# Patient Record
Sex: Male | Born: 1950 | Race: White | Hispanic: No | Marital: Single | Smoking: Never smoker
Health system: Southern US, Community
[De-identification: ages and names within clinical notes are randomized; demographics above are authoritative.]

## PROBLEM LIST (undated history)

## (undated) DIAGNOSIS — E785 Hyperlipidemia, unspecified: Secondary | ICD-10-CM

## (undated) DIAGNOSIS — M1990 Unspecified osteoarthritis, unspecified site: Secondary | ICD-10-CM

## (undated) DIAGNOSIS — J4520 Mild intermittent asthma, uncomplicated: Secondary | ICD-10-CM

## (undated) DIAGNOSIS — F329 Major depressive disorder, single episode, unspecified: Secondary | ICD-10-CM

## (undated) DIAGNOSIS — C801 Malignant (primary) neoplasm, unspecified: Secondary | ICD-10-CM

## (undated) DIAGNOSIS — F419 Anxiety disorder, unspecified: Secondary | ICD-10-CM

## (undated) DIAGNOSIS — F32A Depression, unspecified: Secondary | ICD-10-CM

## (undated) DIAGNOSIS — I10 Essential (primary) hypertension: Secondary | ICD-10-CM

## (undated) DIAGNOSIS — K219 Gastro-esophageal reflux disease without esophagitis: Secondary | ICD-10-CM

## (undated) DIAGNOSIS — N189 Chronic kidney disease, unspecified: Secondary | ICD-10-CM

## (undated) HISTORY — DX: Unspecified osteoarthritis, unspecified site: M19.90

## (undated) HISTORY — DX: Malignant (primary) neoplasm, unspecified: C80.1

## (undated) HISTORY — DX: Chronic kidney disease, unspecified: N18.9

## (undated) HISTORY — DX: Anxiety disorder, unspecified: F41.9

## (undated) HISTORY — PX: HERNIA REPAIR: SHX51

## (undated) HISTORY — DX: Major depressive disorder, single episode, unspecified: F32.9

## (undated) HISTORY — DX: Depression, unspecified: F32.A

## (undated) HISTORY — DX: Hyperlipidemia, unspecified: E78.5

## (undated) HISTORY — DX: Gastro-esophageal reflux disease without esophagitis: K21.9

## (undated) HISTORY — DX: Mild intermittent asthma, uncomplicated: J45.20

## (undated) HISTORY — PX: APPENDECTOMY: SHX54

## (undated) HISTORY — PX: FINGER AMPUTATION: SHX636

## (undated) HISTORY — DX: Essential (primary) hypertension: I10

---

## 2007-05-05 ENCOUNTER — Emergency Department (HOSPITAL_COMMUNITY): Admission: EM | Admit: 2007-05-05 | Discharge: 2007-05-06 | Payer: Self-pay | Admitting: Emergency Medicine

## 2007-05-14 ENCOUNTER — Emergency Department (HOSPITAL_COMMUNITY): Admission: EM | Admit: 2007-05-14 | Discharge: 2007-05-14 | Payer: Self-pay | Admitting: Emergency Medicine

## 2007-10-26 IMAGING — CT CT ABDOMEN W/ CM
1 of 3 series · 13 of 32 positions shown, 19 images · IV contrast (Omnipaque 300)
Comparison: None

CLINICAL DATA: Abdominal and pelvic pain status post injury to the abdomen.  Abdominal distention 
 ABDOMEN CT WITH CONTRAST:
TECHNIQUE: Multidetector CT imaging of the abdomen was performed following the standard protocol during bolus administration of intravenous contrast.
 Contrast:  Oral contrast and 100 cc intravenous Omnipaque 300.
TECHNIQUE: Multidetector CT imaging of the pelvis was performed following the standard protocol during bolus administration of intravenous contrast.

[Series 2: abd_pel 5.0 b40f · axial · 0.80mm/px · z∈[-512,-82]mm · 13 of 102 slices shown, 19 images]
[im 8/102  soft-tissue]
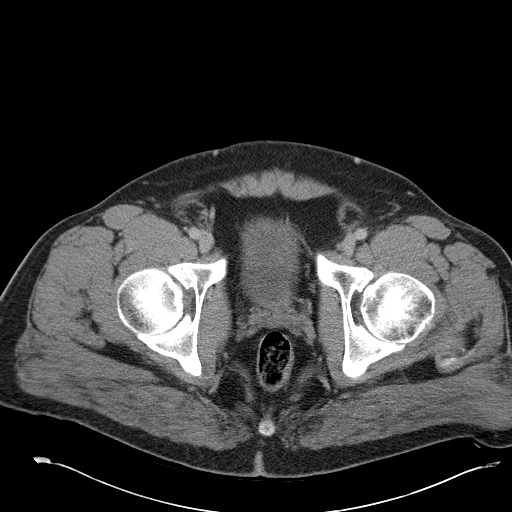
[im 8/102  bone]
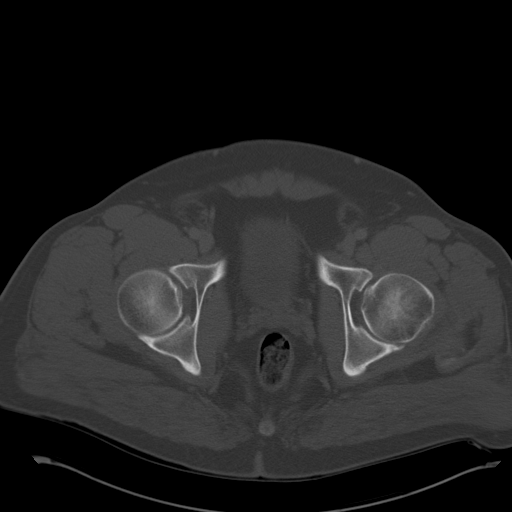
[im 15/102  soft-tissue]
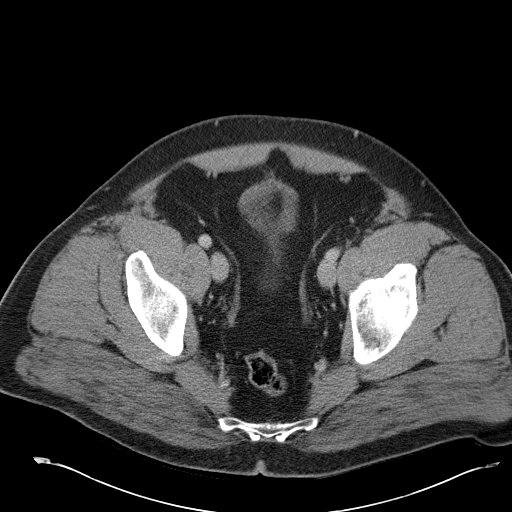
[im 22/102  soft-tissue]
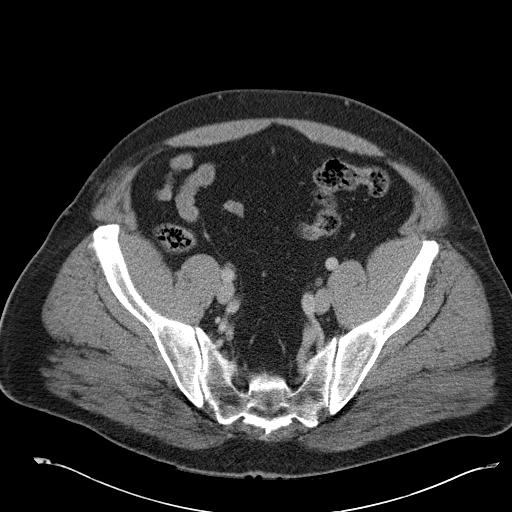
[im 29/102  soft-tissue]
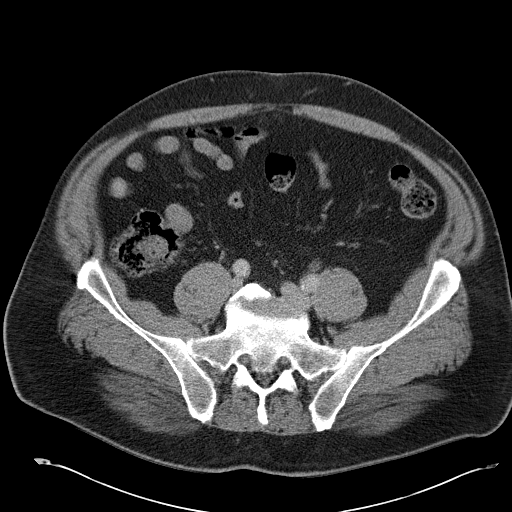
[im 37/102  soft-tissue]
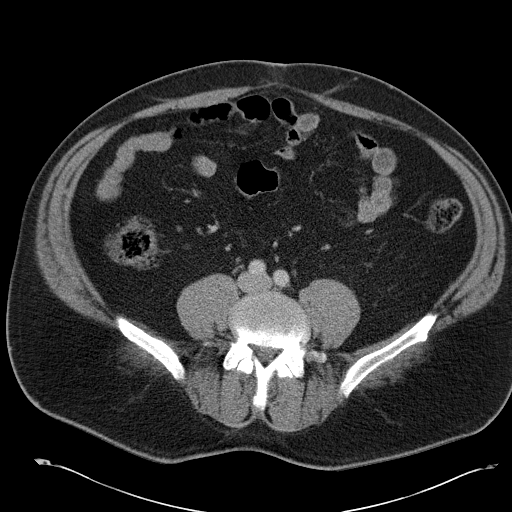
[im 44/102  soft-tissue]
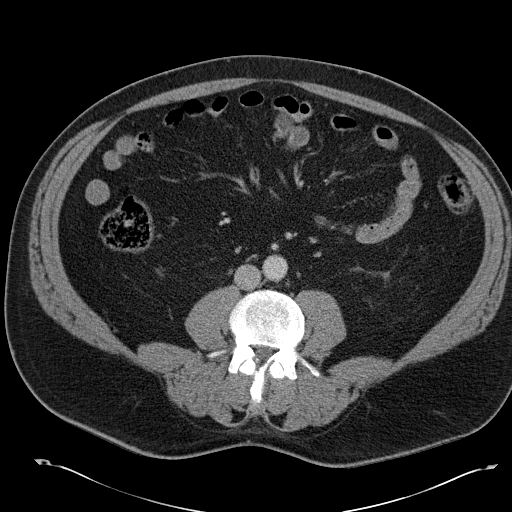
[im 51/102  soft-tissue]
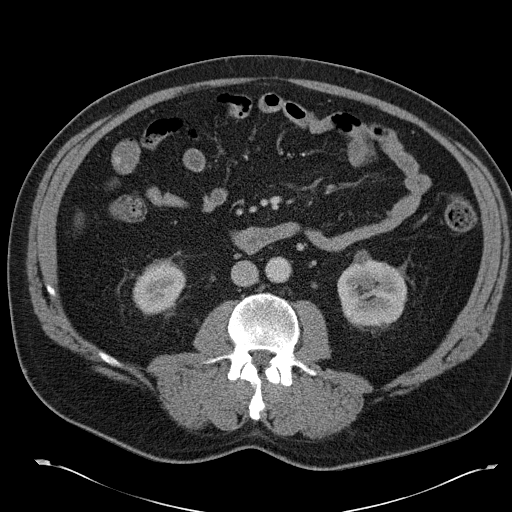
[im 58/102  soft-tissue]
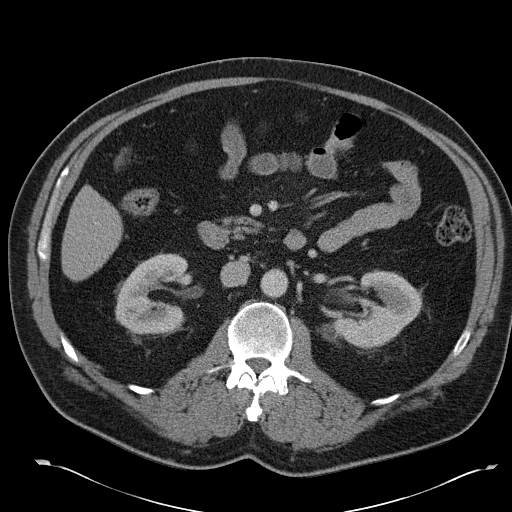
[im 65/102  soft-tissue]
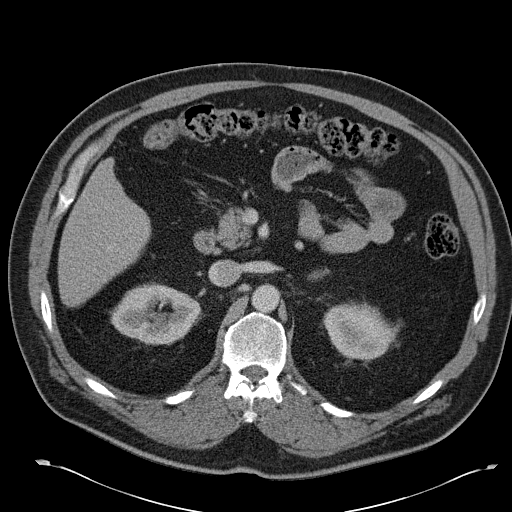
[im 65/102  bone]
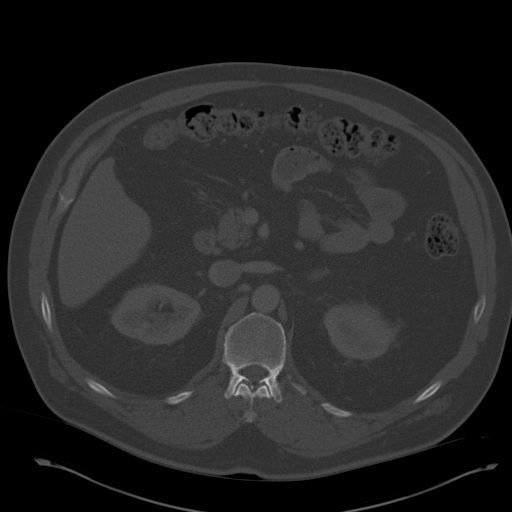
[im 73/102  soft-tissue]
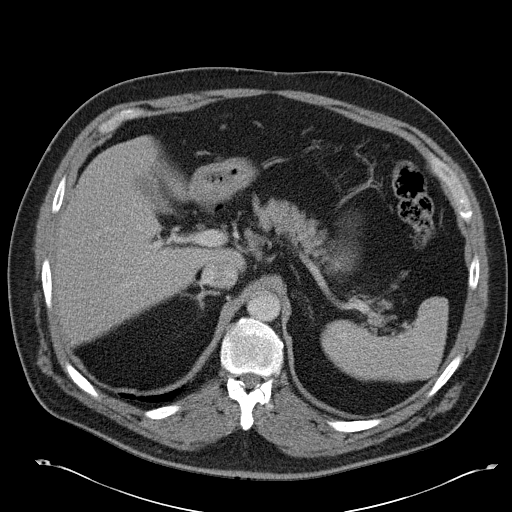
[im 73/102  lung]
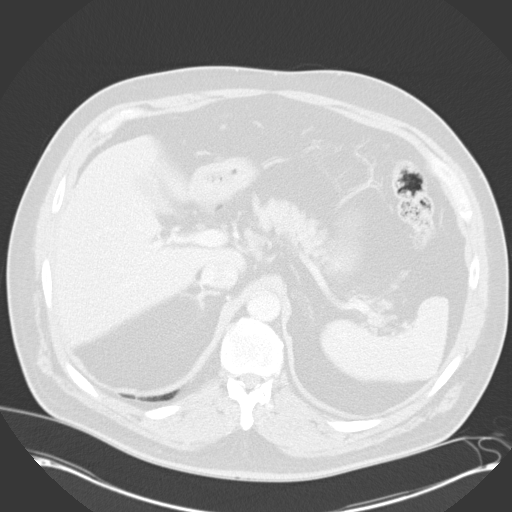
[im 80/102  soft-tissue]
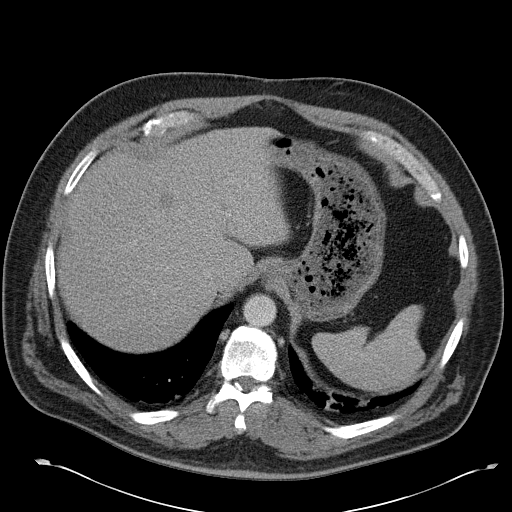
[im 80/102  lung]
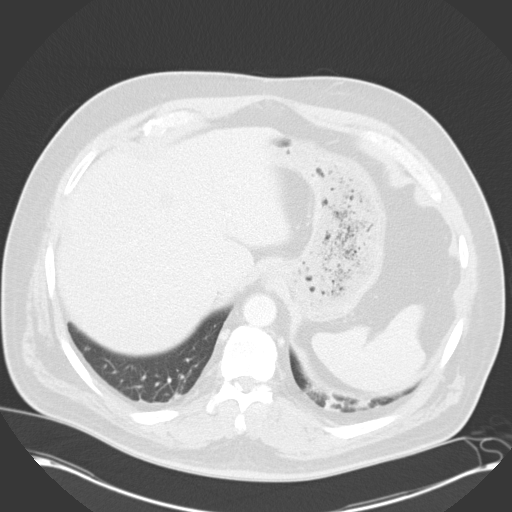
[im 87/102  soft-tissue]
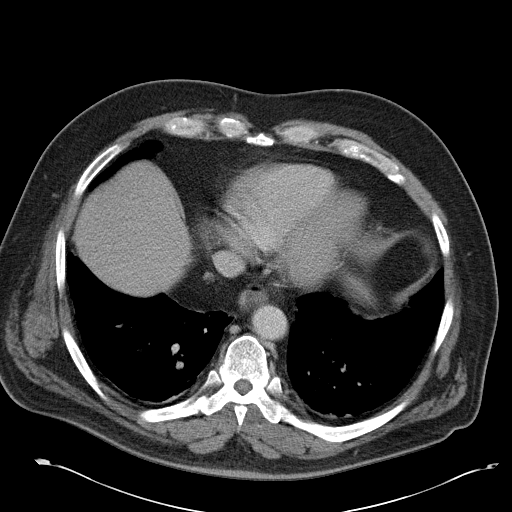
[im 87/102  lung]
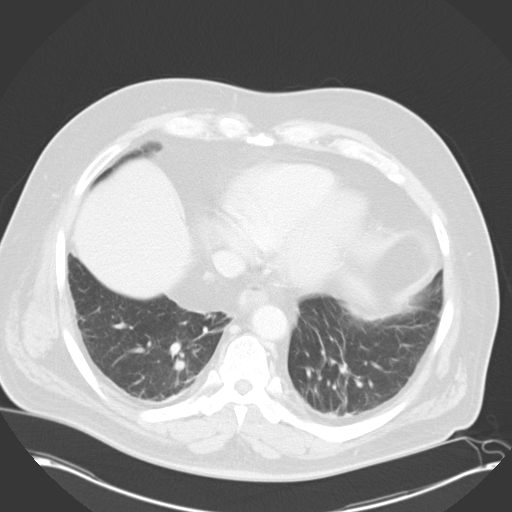
[im 94/102  soft-tissue]
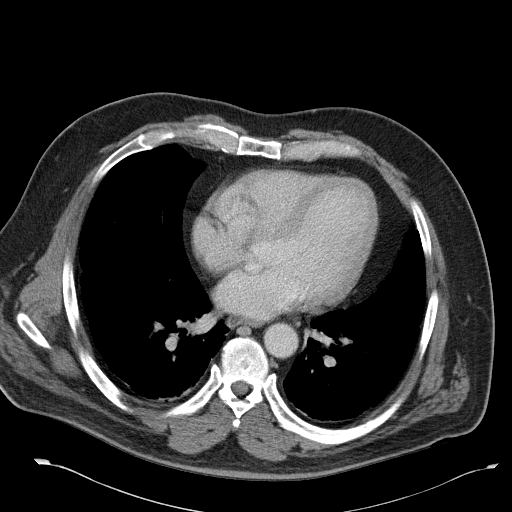
[im 94/102  lung]
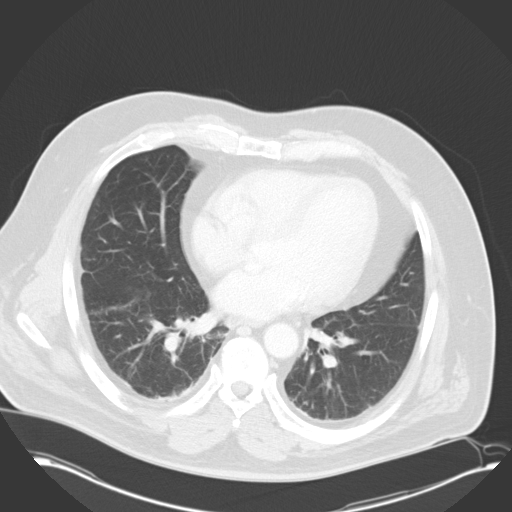

[13 of 32 positions shown; findings below may reference images not displayed]

FINDINGS: A few hepatic cysts are noted with the remainder of the liver, spleen, adrenal glands, pancreas, and gallbladder are unremarkable.  Low density lesions within the left kidney are indeterminate but probably represent cysts as these lesions all measures 10 Hounsfield units or less.   No evidence of free fluid, enlarged lymph nodes, abdominal aortic aneurysm, or biliary dilatation.   Small umbilical hernia containing fat is identified. The visualized bowel is within normal limits.
IMPRESSION: No acute abnormalities.  
 PELVIS CT WITH CONTRAST:
FINDINGS: No evidence of free fluid collection or enlarged lymph nodes.  The bowel and bladder are within normal limits.  Degenerative changes in the lower lumbar spine identified.
IMPRESSION: No acute abnormalities.

## 2011-07-15 LAB — URINALYSIS, ROUTINE W REFLEX MICROSCOPIC
Bilirubin Urine: NEGATIVE
Nitrite: NEGATIVE
Specific Gravity, Urine: <1.005 — ABNORMAL LOW
Urobilinogen, UA: 0.2
pH: 6

## 2011-07-15 LAB — AMYLASE: Amylase: 43

## 2011-07-15 LAB — DIFFERENTIAL
Basophils Absolute: 0
Basophils Absolute: 0.1
Basophils Relative: 1
Basophils Relative: 1
Eosinophils Absolute: 0.2
Lymphocytes Relative: 19
Lymphocytes Relative: 31
Monocytes Absolute: 0.4
Monocytes Absolute: 0.4
Neutro Abs: 5.2
Neutrophils Relative %: 73

## 2011-07-15 LAB — BASIC METABOLIC PANEL
BUN: 12
BUN: 5 — ABNORMAL LOW
CO2: 26
Calcium: 8.7
Chloride: 103
Creatinine, Ser: 1.03
GFR calc Af Amer: >60
GFR calc Af Amer: >60
GFR calc non Af Amer: >60
GFR calc non Af Amer: >60
Glucose, Bld: 163 — ABNORMAL HIGH
Potassium: 3.6
Potassium: 3.8
Sodium: 137
Sodium: 141

## 2011-07-15 LAB — CBC
HCT: 44.3
HCT: 45.6
Hemoglobin: 15
Hemoglobin: 15.4
MCHC: 33.8
MCHC: 34
MCV: 84.8
MCV: 84.9
Platelets: 300
Platelets: 302
RBC: 5.22
RBC: 5.37
RDW: 12.9
RDW: 12.9
WBC: 6.4
WBC: 7.1

## 2016-12-30 DIAGNOSIS — K219 Gastro-esophageal reflux disease without esophagitis: Secondary | ICD-10-CM | POA: Diagnosis not present

## 2016-12-30 DIAGNOSIS — E785 Hyperlipidemia, unspecified: Secondary | ICD-10-CM | POA: Diagnosis not present

## 2016-12-30 DIAGNOSIS — R51 Headache: Secondary | ICD-10-CM | POA: Diagnosis not present

## 2017-05-16 ENCOUNTER — Ambulatory Visit (INDEPENDENT_AMBULATORY_CARE_PROVIDER_SITE_OTHER): Payer: Medicare Other | Admitting: Family Medicine

## 2017-05-16 ENCOUNTER — Encounter: Payer: Self-pay | Admitting: Family Medicine

## 2017-05-16 VITALS — BP 150/76 | HR 64 | Temp 98.7°F | Resp 18 | Ht 72.5 in | Wt 283.1 lb

## 2017-05-16 DIAGNOSIS — J4520 Mild intermittent asthma, uncomplicated: Secondary | ICD-10-CM | POA: Diagnosis not present

## 2017-05-16 DIAGNOSIS — I10 Essential (primary) hypertension: Secondary | ICD-10-CM

## 2017-05-16 DIAGNOSIS — K219 Gastro-esophageal reflux disease without esophagitis: Secondary | ICD-10-CM | POA: Insufficient documentation

## 2017-05-16 DIAGNOSIS — Z1211 Encounter for screening for malignant neoplasm of colon: Secondary | ICD-10-CM | POA: Diagnosis not present

## 2017-05-16 DIAGNOSIS — Z9109 Other allergy status, other than to drugs and biological substances: Secondary | ICD-10-CM

## 2017-05-16 DIAGNOSIS — E559 Vitamin D deficiency, unspecified: Secondary | ICD-10-CM

## 2017-05-16 DIAGNOSIS — F339 Major depressive disorder, recurrent, unspecified: Secondary | ICD-10-CM | POA: Diagnosis not present

## 2017-05-16 DIAGNOSIS — E785 Hyperlipidemia, unspecified: Secondary | ICD-10-CM

## 2017-05-16 DIAGNOSIS — G4700 Insomnia, unspecified: Secondary | ICD-10-CM

## 2017-05-16 DIAGNOSIS — H1010 Acute atopic conjunctivitis, unspecified eye: Secondary | ICD-10-CM | POA: Insufficient documentation

## 2017-05-16 DIAGNOSIS — H1013 Acute atopic conjunctivitis, bilateral: Secondary | ICD-10-CM

## 2017-05-16 DIAGNOSIS — Z85828 Personal history of other malignant neoplasm of skin: Secondary | ICD-10-CM | POA: Diagnosis not present

## 2017-05-16 HISTORY — DX: Mild intermittent asthma, uncomplicated: J45.20

## 2017-05-16 MED ORDER — MIRTAZAPINE 15 MG PO TABS: 15.0000 mg | ORAL_TABLET | Freq: Every day | ORAL | 11 refills | Status: DC

## 2017-05-16 NOTE — Progress Notes (Signed)
Chief Complaint  Patient presents with  . Follow-up  This is a new patient. I have no medical records. He is accompanied by a Education officer, museum who helps him with appointments and paying his bills. He only went sixth grade and doesn't read. He is not medically sophisticated. He doesn't notice diagnoses. He does bring in his medications, his problem list is populated from the usual diagnoses associated with the pills he takes. He has blood pressure, well controlled. He has hyperlipidemia. Uncertain when he had his lab work last. He has allergies, and allergic conjunctivitis. He has albuterol inhaler, and never smoked. Assume he has asthma. He takes ranitidine for "bad heartburn". He states he has a history of depression. He has difficulty sleeping. He was recently started on trazodone. He states that it made him hallucinate. He would like something else to sleep. I'm going to try him on mirtazapine. He lives in his own home. He takes care of his home in his yard. He has help with his finances, mail, shopping, and paperwork. He states previously he would drive a vehicle but does not currently. He is not up-to-date with health care. He cannot tell me his immunizations. he's never had a colonoscopy. He does have a history of multiple skin cancers. Unclear type. Patient Active Problem List   Diagnosis Date Noted  . Essential hypertension 05/16/2017  . HLD (hyperlipidemia) 05/16/2017  . Environmental allergies 05/16/2017  . Allergic conjunctivitis 05/16/2017  . Vitamin D deficiency 05/16/2017  . Asthma in adult, mild intermittent, uncomplicated 23/55/7322  . GERD without esophagitis 05/16/2017  . Depression, recurrent ([place]) 05/16/2017  . Insomnia 05/16/2017    Outpatient Encounter Prescriptions as of 05/16/2017  Medication Sig  . albuterol (PROVENTIL HFA;VENTOLIN HFA) 108 (90 Base) MCG/ACT inhaler Inhale into the lungs every 6 (six) hours as needed for wheezing or shortness of breath.  Marland Kitchen  amLODipine (NORVASC) 10 MG tablet Take 10 mg by mouth daily.  Marland Kitchen aspirin EC 81 MG tablet Take 81 mg by mouth daily.  . cetirizine (ZYRTEC) 10 MG tablet Take 10 mg by mouth daily.  . cholecalciferol (VITAMIN D) 1000 units tablet Take 1,000 Units by mouth daily.  . fluticasone (FLONASE) 50 MCG/ACT nasal spray Place into both nostrils daily.  Marland Kitchen lisinopril-hydrochlorothiazide (PRINZIDE,ZESTORETIC) 20-25 MG tablet Take 1 tablet by mouth daily.  Marland Kitchen olopatadine (PATANOL) 0.1 % ophthalmic solution Place 1 drop into both eyes daily.  . pravastatin (PRAVACHOL) 80 MG tablet Take 80 mg by mouth daily.  . ranitidine (ZANTAC) 150 MG capsule Take 150 mg by mouth 2 (two) times daily.  . mirtazapine (REMERON) 15 MG tablet Take 1 tablet (15 mg total) by mouth at bedtime.   No facility-administered encounter medications on file as of 05/16/2017.     Past Medical History:  Diagnosis Date  . Anxiety   . Arthritis    left shoulder and knee  . Asthma in adult, mild intermittent, uncomplicated 0/25/4270  . Cancer ([place])    skin  . Depression   . GERD (gastroesophageal reflux disease)   . Hyperlipidemia   . Hypertension     Past Surgical History:  Procedure Laterality Date  . APPENDECTOMY    . FINGER AMPUTATION    . HERNIA REPAIR      Social History   Social History  . Marital status: Single    Spouse name: N/A  . Number of children: 0  . Years of education: 6   Occupational History  . retired  Social History Main Topics  . Smoking status: Never Smoker  . Smokeless tobacco: Never Used  . Alcohol use No  . Drug use: No  . Sexual activity: Not Currently   Other Topics Concern  . Not on file   Social History Narrative   Lives alone   Niece and sisters look in   Does not drive   Does not read well   "Congregates in the country store"    Family History  Problem Relation Age of Onset  . Heart disease Father   . Cancer Father   . COPD Sister   . Cancer Brother   . Pneumonia  Brother   . Cancer Brother     Review of Systems  Constitutional: Negative for chills, fever and weight loss.  HENT: Negative for congestion and hearing loss.   Eyes: Negative for blurred vision and pain.  Respiratory: Negative for cough and shortness of breath.   Cardiovascular: Negative for chest pain and leg swelling.  Gastrointestinal: Negative for abdominal pain, constipation, diarrhea and heartburn.  Genitourinary: Negative for dysuria and frequency.  Musculoskeletal: Negative for falls, joint pain and myalgias.  Neurological: Negative for dizziness, seizures and headaches.  Psychiatric/Behavioral: Negative for depression. The patient has insomnia. The patient is not nervous/anxious.    denies complaints  BP (!) 150/76 (BP Location: Right Arm, Patient Position: Sitting, Cuff Size: Large)   Pulse 64   Temp 98.7 F (37.1 C) (Temporal)   Resp 18   Ht 6' 0.5" (1.842 m)   Wt 283 lb 1.9 oz (128.4 kg)   SpO2 97%   BMI 37.87 kg/m   Physical Exam  Constitutional: He is oriented to person, place, and time. He appears well-developed and well-nourished.  Overweight, abdominal obesity  HENT:  Head: Normocephalic and atraumatic.  Mouth/Throat: Oropharynx is clear and moist.  Edentulous  Eyes: Pupils are equal, round, and reactive to light. Conjunctivae are normal.  Neck: Normal range of motion. Neck supple. No thyromegaly present.  Cardiovascular: Normal rate, regular rhythm and normal heart sounds.   Pulmonary/Chest: Effort normal and breath sounds normal. No respiratory distress.  Abdominal: Soft. Bowel sounds are normal.  Musculoskeletal: Normal range of motion. He exhibits no edema.  Lymphadenopathy:    He has no cervical adenopathy.  Neurological: He is alert and oriented to person, place, and time.  Gait normal  Skin: Skin is warm and dry.  Medial canthus of left eye on nose there is a scar with lesion at the periphery suspicious for recurring cancer  Psychiatric: He has  a normal mood and affect. His behavior is normal. Thought content normal.  Pleasant. Articulate.  Nursing note and vitals reviewed. ASSESSMENT/PLAN:   1. Essential hypertension - CBC - COMPLETE METABOLIC PANEL WITH GFR - Urinalysis, Routine w reflex microscopic 2. Hyperlipidemia, unspecified hyperlipidemia type - Lipid panel 3. Environmental allergies  4. Allergic conjunctivitis of both eyes  5. Vitamin D deficiency - VITAMIN D 25 Hydroxy (Vit-D Deficiency, Fractures)  6. Asthma in adult, mild intermittent, uncomplicated  7. GERD without esophagitis  8. Depression, recurrent ([place])  9. Insomnia, unspecified type  10. Screening for colon cancer  11. History of skin cancer - Ambulatory referral to Dermatology   Patient Instructions  Stop trazodone Take the mirtazapine at bedtime Need blood tests Referred to dermatology Colon cancer test ordered  Need records Dr Wenda Overland  See me in a month for a PE    Raylene Everts, MD

## 2017-05-16 NOTE — Patient Instructions (Signed)
Stop trazodone Take the mirtazapine at bedtime Need blood tests Referred to dermatology Colon cancer test ordered  Need records Dr Wenda Overland  See me in a month for a PE

## 2017-05-21 DIAGNOSIS — I10 Essential (primary) hypertension: Secondary | ICD-10-CM | POA: Diagnosis not present

## 2017-05-21 DIAGNOSIS — E559 Vitamin D deficiency, unspecified: Secondary | ICD-10-CM | POA: Diagnosis not present

## 2017-05-21 DIAGNOSIS — E785 Hyperlipidemia, unspecified: Secondary | ICD-10-CM | POA: Diagnosis not present

## 2017-05-21 LAB — CBC
HEMATOCRIT: 45.3 % (ref 38.5–50.0)
HEMOGLOBIN: 15.3 g/dL (ref 13.2–17.1)
MCH: 28.7 pg (ref 27.0–33.0)
MCHC: 33.8 g/dL (ref 32.0–36.0)
MCV: 85 fL (ref 80.0–100.0)
MPV: 9.7 fL (ref 7.5–12.5)
Platelets: 331 10*3/uL (ref 140–400)
RBC: 5.33 MIL/uL (ref 4.20–5.80)
RDW: 13.8 % (ref 11.0–15.0)
WBC: 6.8 10*3/uL (ref 3.8–10.8)

## 2017-05-21 LAB — COMPLETE METABOLIC PANEL WITH GFR
ALBUMIN: 4.4 g/dL (ref 3.6–5.1)
ALK PHOS: 55 U/L (ref 40–115)
ALT: 15 U/L (ref 9–46)
AST: 14 U/L (ref 10–35)
BILIRUBIN TOTAL: 1.2 mg/dL (ref 0.2–1.2)
BUN: 11 mg/dL (ref 7–25)
CALCIUM: 9.4 mg/dL (ref 8.6–10.3)
CO2: 26 mmol/L (ref 20–32)
CREATININE: 1.41 mg/dL — AB (ref 0.70–1.25)
Chloride: 101 mmol/L (ref 98–110)
GFR, EST AFRICAN AMERICAN: 59 mL/min — AB (ref >=60)
GFR, Est Non African American: 51 mL/min — ABNORMAL LOW (ref >=60)
Glucose, Bld: 101 mg/dL — ABNORMAL HIGH (ref 65–99)
Potassium: 3.8 mmol/L (ref 3.5–5.3)
Sodium: 137 mmol/L (ref 135–146)
TOTAL PROTEIN: 7.3 g/dL (ref 6.1–8.1)

## 2017-05-21 LAB — LIPID PANEL
CHOLESTEROL: 155 mg/dL (ref <200)
HDL: 42 mg/dL (ref >40)
LDL Cholesterol: 86 mg/dL (ref <100)
TRIGLYCERIDES: 134 mg/dL (ref <150)
Total CHOL/HDL Ratio: 3.7 Ratio (ref <5.0)
VLDL: 27 mg/dL (ref <30)

## 2017-05-22 LAB — URINALYSIS, ROUTINE W REFLEX MICROSCOPIC
Bilirubin Urine: NEGATIVE
Glucose, UA: NEGATIVE
HGB URINE DIPSTICK: NEGATIVE
KETONES UR: NEGATIVE
LEUKOCYTES UA: NEGATIVE
NITRITE: NEGATIVE
PH: 6 (ref 5.0–8.0)
Protein, ur: NEGATIVE
SPECIFIC GRAVITY, URINE: 1.006 (ref 1.001–1.035)

## 2017-05-22 LAB — VITAMIN D 25 HYDROXY (VIT D DEFICIENCY, FRACTURES): Vit D, 25-Hydroxy: 36 ng/mL (ref 30–100)

## 2017-05-27 ENCOUNTER — Encounter: Payer: Self-pay | Admitting: Family Medicine

## 2017-06-06 ENCOUNTER — Encounter: Payer: Self-pay | Admitting: Family Medicine

## 2017-06-06 DIAGNOSIS — C4492 Squamous cell carcinoma of skin, unspecified: Secondary | ICD-10-CM | POA: Insufficient documentation

## 2017-06-19 ENCOUNTER — Encounter: Payer: Medicare Other | Admitting: Family Medicine

## 2017-06-24 ENCOUNTER — Other Ambulatory Visit: Payer: Self-pay | Admitting: Family Medicine

## 2017-07-21 ENCOUNTER — Encounter: Payer: Medicare Other | Admitting: Family Medicine

## 2017-08-04 ENCOUNTER — Encounter: Payer: Medicare Other | Admitting: Family Medicine

## 2017-08-06 ENCOUNTER — Ambulatory Visit (INDEPENDENT_AMBULATORY_CARE_PROVIDER_SITE_OTHER): Payer: Medicare Other | Admitting: Family Medicine

## 2017-08-06 ENCOUNTER — Other Ambulatory Visit: Payer: Self-pay

## 2017-08-06 ENCOUNTER — Encounter: Payer: Self-pay | Admitting: Family Medicine

## 2017-08-06 VITALS — BP 140/76 | HR 60 | Temp 98.8°F | Resp 18 | Ht 73.0 in | Wt 290.0 lb

## 2017-08-06 DIAGNOSIS — Z23 Encounter for immunization: Secondary | ICD-10-CM

## 2017-08-06 DIAGNOSIS — E785 Hyperlipidemia, unspecified: Secondary | ICD-10-CM | POA: Diagnosis not present

## 2017-08-06 DIAGNOSIS — Z1211 Encounter for screening for malignant neoplasm of colon: Secondary | ICD-10-CM

## 2017-08-06 DIAGNOSIS — Z0000 Encounter for general adult medical examination without abnormal findings: Secondary | ICD-10-CM | POA: Diagnosis not present

## 2017-08-06 DIAGNOSIS — I10 Essential (primary) hypertension: Secondary | ICD-10-CM | POA: Diagnosis not present

## 2017-08-06 DIAGNOSIS — K219 Gastro-esophageal reflux disease without esophagitis: Secondary | ICD-10-CM | POA: Diagnosis not present

## 2017-08-06 DIAGNOSIS — I499 Cardiac arrhythmia, unspecified: Secondary | ICD-10-CM

## 2017-08-06 NOTE — Patient Instructions (Addendum)
Need blood work and a follow up in six months No change in medicine Still need to see a dermatologist I ordered home test for colon cancer  Flu shot today

## 2017-08-06 NOTE — Progress Notes (Signed)
Chief Complaint  Patient presents with  . Annual Exam   Patient is here for his annual exam He voices no concerns   He had recent lab work performed.  The results are reviewed with him.  His cholesterol is well controlled with an LDL of 86.  No diabetes.  Kidney function minimally impaired with a creatinine of 1.4.  Will follow.  Advised him to stop drinking Pepsi and drink more water.  No problems with bowels or digestion, no problems with urination.  No recent use of his inhaler problems with asthma. He was started on mirtazapine to help him sleep at night.  He states this works well for him. He does not have any chest pain or shortness of breath.  Risk factors for cardiovascular disease include obesity, hypertension, hyperlipidemia.  Patient Active Problem List   Diagnosis Date Noted  . Squamous cell skin cancer 06/06/2017  . Essential hypertension 05/16/2017  . HLD (hyperlipidemia) 05/16/2017  . Environmental allergies 05/16/2017  . Allergic conjunctivitis 05/16/2017  . Vitamin D deficiency 05/16/2017  . Asthma in adult, mild intermittent, uncomplicated 02/72/5366  . GERD without esophagitis 05/16/2017  . Depression, recurrent ([place]) 05/16/2017  . Insomnia 05/16/2017    Outpatient Encounter Medications as of 08/06/2017  Medication Sig  . albuterol (PROVENTIL HFA;VENTOLIN HFA) 108 (90 Base) MCG/ACT inhaler Inhale into the lungs every 6 (six) hours as needed for wheezing or shortness of breath.  Marland Kitchen amLODipine (NORVASC) 10 MG tablet Take 10 mg by mouth daily.  Marland Kitchen aspirin EC 81 MG tablet Take 81 mg by mouth daily.  . cetirizine (ZYRTEC) 10 MG tablet Take 10 mg by mouth daily.  . cholecalciferol (VITAMIN D) 1000 units tablet Take 1,000 Units by mouth daily.  . fluticasone (FLONASE) 50 MCG/ACT nasal spray Place into both nostrils daily.  Marland Kitchen lisinopril-hydrochlorothiazide (PRINZIDE,ZESTORETIC) 20-25 MG tablet Take 1 tablet by mouth daily.  . mirtazapine (REMERON) 15 MG tablet Take 1  tablet (15 mg total) by mouth at bedtime.  Marland Kitchen olopatadine (PATANOL) 0.1 % ophthalmic solution Place 1 drop into both eyes daily.  . pravastatin (PRAVACHOL) 80 MG tablet Take 80 mg by mouth daily.  . ranitidine (ZANTAC) 150 MG capsule Take 150 mg by mouth 2 (two) times daily.  . [DISCONTINUED] traZODone (DESYREL) 100 MG tablet TAKE 1/2 TO 1 TABLET BY MOUTH AT BEDTIME   No facility-administered encounter medications on file as of 08/06/2017.     Allergies  Allergen Reactions  . Trazodone And Nefazodone Other (See Comments)    Hallucination   . Benadryl [Diphenhydramine] Other (See Comments)    Shakes, tremor    Review of Systems  Constitutional: Positive for unexpected weight change. Negative for activity change and appetite change.       Weight gain  HENT: Negative for congestion, dental problem, postnasal drip and rhinorrhea.   Eyes: Negative for photophobia and visual disturbance.  Respiratory: Negative for cough and shortness of breath.   Cardiovascular: Negative for chest pain, palpitations and leg swelling.  Gastrointestinal: Negative for blood in stool, constipation and diarrhea.  Genitourinary: Negative for difficulty urinating and frequency.  Musculoskeletal: Negative for arthralgias, back pain and gait problem.  Skin: Negative for color change and rash.  Neurological: Negative for dizziness, light-headedness and headaches.  Psychiatric/Behavioral: Negative for decreased concentration and sleep disturbance. The patient is not nervous/anxious.     BP 140/76 (BP Location: Right Arm, Patient Position: Supine)   Pulse 60   Temp 98.8 F (37.1 C) (Temporal)  Resp 18   Ht 6\' 1"  (1.854 m)   Wt 290 lb (131.5 kg)   SpO2 96%   BMI 38.26 kg/m   Physical Exam BP 140/76 (BP Location: Right Arm, Patient Position: Supine)   Pulse 60   Temp 98.8 F (37.1 C) (Temporal)   Resp 18   Ht 6\' 1"  (1.854 m)   Wt 290 lb (131.5 kg)   SpO2 96%   BMI 38.26 kg/m   General Appearance:     Alert, cooperative, no distress, appears stated age  Head:    Normocephalic, without obvious abnormality, atraumatic  Eyes:    PERRL, conjunctiva/corneas clear, EOM's intact, fundi    benign, both eyes       Ears:    Normal TM's and external ear canals, both ears  Nose:   Nares normal, septum midline, mucosa normal, no drainage   or sinus tenderness  Throat:   Lips, mucosa, and tongue normal;gums normal.  No teeth although there are a couple of brown spots on the posterior alveolar ridge likely broken off at the gumline  Neck:   Supple, symmetrical, trachea midline, no adenopathy;       thyroid:  No enlargement/tenderness/nodules; no carotid   bruit  Back:     Symmetric, no curvature, ROM normal, no CVA tenderness  Lungs:     Clear to auscultation bilaterally, respirations unlabored  Chest wall:    No tenderness or deformity  Heart:    Regular rate and rhythm, S1 and S2 normal, no murmur, rub   or gallop.  Bradycardia, rare ectopy  Abdomen:     Soft, non-tender, bowel sounds active all four quadrants,    no masses, no organomegaly.  Minimally tender to deep palpation at the epigastrium.  Obese abdomen.  Genitalia:    Normal male without lesion, discharge or tenderness  Extremities:   Extremities normal, atraumatic, no cyanosis or edema  Pulses:   2+ and symmetric upper extremities, trace pedal pulses  Skin:   Skin color, texture, turgor normal, no rashes or lesions  Lymph nodes:   Cervical, supraclavicular, and axillary nodes normal  Neurologic:   Normal strength, sensation and reflexes      throughout    ASSESSMENT/PLAN:  1. Need for influenza vaccination - Flu Vaccine QUAD 36+ mos IM  2. Essential hypertension Controlled - CBC - COMPLETE METABOLIC PANEL WITH GFR - Urinalysis, Routine w reflex microscopic  3. GERD without esophagitis No symptoms but mildly tender to palpation  4. Hyperlipidemia, unspecified hyperlipidemia type Controlled on Zocor - Lipid panel  5.  Screening for colon cancer Patient initially refuses colon cancer screening.  He does agree to a cologuard, and will have a colonoscopy only if it is positive - Cologuard  6. Irregular heart beat Sinus bradycardia - EKG 12-Lead   Patient Instructions  Need blood work and a follow up in six months No change in medicine Still need to see a dermatologist I ordered home test for colon cancer  Flu shot today   Raylene Everts, MD

## 2017-09-09 ENCOUNTER — Other Ambulatory Visit: Payer: Self-pay | Admitting: Family Medicine

## 2017-09-09 NOTE — Telephone Encounter (Signed)
Seen 11 7 18 

## 2017-10-24 ENCOUNTER — Other Ambulatory Visit: Payer: Self-pay | Admitting: Family Medicine

## 2017-11-13 ENCOUNTER — Other Ambulatory Visit: Payer: Self-pay | Admitting: Family Medicine

## 2017-12-08 ENCOUNTER — Other Ambulatory Visit: Payer: Self-pay | Admitting: Family Medicine

## 2017-12-08 ENCOUNTER — Encounter: Payer: Self-pay | Admitting: Family Medicine

## 2017-12-24 ENCOUNTER — Other Ambulatory Visit: Payer: Self-pay | Admitting: Family Medicine

## 2017-12-27 ENCOUNTER — Other Ambulatory Visit: Payer: Self-pay | Admitting: Family Medicine

## 2018-01-14 ENCOUNTER — Other Ambulatory Visit: Payer: Self-pay | Admitting: Family Medicine

## 2018-01-30 ENCOUNTER — Ambulatory Visit (INDEPENDENT_AMBULATORY_CARE_PROVIDER_SITE_OTHER): Payer: Medicare Other | Admitting: Family Medicine

## 2018-01-30 ENCOUNTER — Encounter: Payer: Self-pay | Admitting: Family Medicine

## 2018-01-30 VITALS — BP 135/80 | HR 52 | Temp 97.9°F | Ht 73.0 in | Wt 291.0 lb

## 2018-01-30 DIAGNOSIS — Z1159 Encounter for screening for other viral diseases: Secondary | ICD-10-CM | POA: Diagnosis not present

## 2018-01-30 DIAGNOSIS — G4700 Insomnia, unspecified: Secondary | ICD-10-CM | POA: Diagnosis not present

## 2018-01-30 DIAGNOSIS — K219 Gastro-esophageal reflux disease without esophagitis: Secondary | ICD-10-CM

## 2018-01-30 DIAGNOSIS — C4492 Squamous cell carcinoma of skin, unspecified: Secondary | ICD-10-CM

## 2018-01-30 DIAGNOSIS — E785 Hyperlipidemia, unspecified: Secondary | ICD-10-CM | POA: Diagnosis not present

## 2018-01-30 DIAGNOSIS — F339 Major depressive disorder, recurrent, unspecified: Secondary | ICD-10-CM

## 2018-01-30 DIAGNOSIS — I10 Essential (primary) hypertension: Secondary | ICD-10-CM | POA: Diagnosis not present

## 2018-01-30 MED ORDER — ESCITALOPRAM OXALATE 20 MG PO TABS: 20.0000 mg | ORAL_TABLET | Freq: Every day | ORAL | 1 refills | Status: DC

## 2018-01-30 NOTE — Progress Notes (Signed)
BP (!) 151/86   Pulse (!) 52   Temp 97.9 F (36.6 C) (Oral)   Ht '6\' 1"'  (1.854 m)   Wt 291 lb (132 kg)   BMI 38.39 kg/m    Subjective:    Patient ID: MONROE QIN, male    DOB: 10/30/1950, 67 y.o.   MRN: [id number]  HPI: BLANE WORTHINGTON is a 67 y.o. male presenting on 01/30/2018 for Establish Care   HPI Depression and social anxiety disorder and insomnia Patient complains of depression anxiety and social anxiety and insomnia.  He is currently on Remeron and says that it just makes him too groggy in the morning and he still wakes up all the time with anxiety and anxious feelings. Depression screen [facility] 2/9 01/30/2018 08/06/2017 05/16/2017  Decreased Interest 0 0 0  Down, Depressed, Hopeless 0 0 0  PHQ - 2 Score 0 0 0    Hyperlipidemia Patient is coming in for recheck of his hyperlipidemia. The patient is currently taking aspirin and pravastatin. They deny any issues with myalgias or history of liver damage from it. They deny any focal numbness or weakness or chest pain.   Hypertension Patient is currently on lisinopril-hydrochlorothiazide and amlodipine and propranolol, and their blood pressure today is 151/86. Patient denies any lightheadedness or dizziness. Patient denies headaches, blurred vision, chest pains, shortness of breath, or weakness. Denies any side effects from medication and is content with current medication.  Squamous cell carcinoma Patient has a squamous cell carcinoma on the left side of his nose that has been growing and they did a referral for Dr. Nevada Crane for dermatology it looks like in August 2018 but patient never did get there or got the call about it.  We will start up a new referral.  He has seen Dr. Tarri Glenn in the past but with not want to see Dr. Tarri Glenn.  He also has a spot that he is concerned about on his left forearm that has been growing as well.  Relevant past medical, surgical, family and social history reviewed and updated as indicated. Interim medical  history since our last visit reviewed. Allergies and medications reviewed and updated.  Review of Systems  Constitutional: Negative for chills and fever.  Eyes: Negative for discharge.  Respiratory: Negative for shortness of breath and wheezing.   Cardiovascular: Negative for chest pain and leg swelling.  Musculoskeletal: Negative for back pain and gait problem.  Skin: Positive for color change and wound. Negative for rash.  Psychiatric/Behavioral: Positive for decreased concentration, dysphoric mood and sleep disturbance. Negative for self-injury and suicidal ideas. The patient is nervous/anxious.   All other systems reviewed and are negative.   Per HPI unless specifically indicated above  Social History   Socioeconomic History  . Marital status: Single    Spouse name: Not on file  . Number of children: 0  . Years of education: 6  . Highest education level: Not on file  Occupational History  . Occupation: retired  Scientific laboratory technician  . Financial resource strain: Not on file  . Food insecurity:    Worry: Not on file    Inability: Not on file  . Transportation needs:    Medical: Not on file    Non-medical: Not on file  Tobacco Use  . Smoking status: Never Smoker  . Smokeless tobacco: Never Used  Substance and Sexual Activity  . Alcohol use: No  . Drug use: No  . Sexual activity: Not Currently  Lifestyle  . Physical  activity:    Days per week: Not on file    Minutes per session: Not on file  . Stress: Not on file  Relationships  . Social connections:    Talks on phone: Not on file    Gets together: Not on file    Attends religious service: Not on file    Active member of club or organization: Not on file    Attends meetings of clubs or organizations: Not on file    Relationship status: Not on file  . Intimate partner violence:    Fear of current or ex partner: Not on file    Emotionally abused: Not on file    Physically abused: Not on file    Forced sexual activity:  Not on file  Other Topics Concern  . Not on file  Social History Narrative   Lives alone   Niece and sisters look in   Does not drive   Does not read well   "Congregates in the country store"    Past Surgical History:  Procedure Laterality Date  . APPENDECTOMY    . FINGER AMPUTATION    . HERNIA REPAIR      Family History  Problem Relation Age of Onset  . Heart disease Father   . Cancer Father   . COPD Sister   . Cancer Brother   . Pneumonia Brother   . Cancer Brother     Allergies as of 01/30/2018      Reactions   Trazodone And Nefazodone Other (See Comments)   Hallucination   Benadryl [diphenhydramine] Other (See Comments)   Shakes, tremor      Medication List        Accurate as of 01/30/18 10:12 AM. Always use your most recent med list.          amLODipine 10 MG tablet Commonly known as:  NORVASC TAKE 1 TABLET EVERY MORNING FOR HIGH BLOOD PRESSURE.   aspirin EC 81 MG tablet Take 81 mg by mouth daily.   cetirizine 10 MG tablet Commonly known as:  ZYRTEC TAKE (1) TABLET BY MOUTH ONCE DAILY.   cholecalciferol 1000 units tablet Commonly known as:  VITAMIN D Take 1,000 Units by mouth daily.   fluticasone 50 MCG/ACT nasal spray Commonly known as:  FLONASE Place into both nostrils daily.   lisinopril-hydrochlorothiazide 20-25 MG tablet Commonly known as:  PRINZIDE,ZESTORETIC TAKE (1) TABLET BY MOUTH ONCE DAILY.   mirtazapine 15 MG tablet Commonly known as:  REMERON Take 1 tablet (15 mg total) by mouth at bedtime.   olopatadine 0.1 % ophthalmic solution Commonly known as:  PATANOL Place 1 drop into both eyes daily.   pravastatin 80 MG tablet Commonly known as:  PRAVACHOL TAKE 1 TABLET BY MOUTH AT BEDTIME.   propranolol 10 MG tablet Commonly known as:  INDERAL TAKE 1 TABLET TWICE DAILY TO HELP HEADACHE AND BLOOD PRESSURE   ranitidine 150 MG capsule Commonly known as:  ZANTAC Take 150 mg by mouth 2 (two) times daily.   ranitidine 150 MG  tablet Commonly known as:  ZANTAC TAKE 1 TABLET BY MOUTH TWICE DAILY.   VENTOLIN HFA 108 (90 Base) MCG/ACT inhaler Generic drug:  albuterol INHALE 2 PUFFS 4 TIMES DAILY.          Objective:    BP (!) 151/86   Pulse (!) 52   Temp 97.9 F (36.6 C) (Oral)   Ht '6\' 1"'  (1.854 m)   Wt 291 lb (132 kg)   BMI  38.39 kg/m   Wt Readings from Last 3 Encounters:  01/30/18 291 lb (132 kg)  08/06/17 290 lb (131.5 kg)  05/16/17 283 lb 1.9 oz (128.4 kg)    Physical Exam  Constitutional: He is oriented to person, place, and time. He appears well-developed and well-nourished. No distress.  Eyes: Pupils are equal, round, and reactive to light. Conjunctivae and EOM are normal. Right eye exhibits no discharge. No scleral icterus.  Neck: Neck supple. No thyromegaly present.  Cardiovascular: Normal rate, regular rhythm, normal heart sounds and intact distal pulses.  No murmur heard. Pulmonary/Chest: Effort normal and breath sounds normal. No respiratory distress. He has no wheezes.  Musculoskeletal: Normal range of motion. He exhibits no edema.  Lymphadenopathy:    He has no cervical adenopathy.  Neurological: He is alert and oriented to person, place, and time. Coordination normal.  Skin: Skin is warm and dry. No rash noted. He is not diaphoretic.  Psychiatric: His behavior is normal. His mood appears anxious. He exhibits a depressed mood. He expresses no suicidal ideation. He expresses no suicidal plans.  Nursing note and vitals reviewed.       Assessment & Plan:   Problem List Items Addressed This Visit      Cardiovascular and Mediastinum   Essential hypertension - Primary   Relevant Orders   CMP14+EGFR (Completed)     Digestive   GERD without esophagitis   Relevant Orders   CBC with Differential/Platelet (Completed)     Musculoskeletal and Integument   Squamous cell skin cancer   Relevant Orders   Ambulatory referral to Dermatology     Other   HLD (hyperlipidemia)    Relevant Orders   Lipid panel (Completed)   Depression, recurrent ([place])   Relevant Medications   escitalopram (LEXAPRO) 20 MG tablet   Insomnia   Relevant Medications   escitalopram (LEXAPRO) 20 MG tablet    Other Visit Diagnoses    Need for hepatitis C screening test       Relevant Orders   Hepatitis C antibody (Completed)      Will start on Lexapro and refer to dermatology for squamous cell cancer No changes in standard medications that he was on previous to seeing me. Follow up plan: Return in about 1 month (around 02/27/2018), or if symptoms worsen or fail to improve, for f/u depression.  Caryl Pina, MD [facility] 01/30/2018, 10:12 AM

## 2018-01-31 LAB — CBC WITH DIFFERENTIAL/PLATELET
BASOS: 1 %
Basophils Absolute: 0.1 10*3/uL (ref 0.0–0.2)
EOS (ABSOLUTE): 0.4 10*3/uL (ref 0.0–0.4)
Eos: 6 %
HEMATOCRIT: 46.4 % (ref 37.5–51.0)
HEMOGLOBIN: 16 g/dL (ref 13.0–17.7)
IMMATURE GRANS (ABS): 0 10*3/uL (ref 0.0–0.1)
Immature Granulocytes: 0 %
LYMPHS ABS: 1.9 10*3/uL (ref 0.7–3.1)
Lymphs: 32 %
MCH: 29.7 pg (ref 26.6–33.0)
MCHC: 34.5 g/dL (ref 31.5–35.7)
MCV: 86 fL (ref 79–97)
MONOCYTES: 10 %
Monocytes Absolute: 0.6 10*3/uL (ref 0.1–0.9)
NEUTROS ABS: 3.2 10*3/uL (ref 1.4–7.0)
Neutrophils: 51 %
Platelets: 315 10*3/uL (ref 150–379)
RBC: 5.38 x10E6/uL (ref 4.14–5.80)
RDW: 13.3 % (ref 12.3–15.4)
WBC: 6.1 10*3/uL (ref 3.4–10.8)

## 2018-01-31 LAB — CMP14+EGFR
A/G RATIO: 1.5 (ref 1.2–2.2)
ALT: 25 IU/L (ref 0–44)
AST: 24 IU/L (ref 0–40)
Albumin: 4.7 g/dL (ref 3.6–4.8)
Alkaline Phosphatase: 60 IU/L (ref 39–117)
BUN / CREAT RATIO: 8 — AB (ref 10–24)
BUN: 11 mg/dL (ref 8–27)
Bilirubin Total: 0.9 mg/dL (ref 0.0–1.2)
CALCIUM: 10 mg/dL (ref 8.6–10.2)
CO2: 24 mmol/L (ref 20–29)
Chloride: 100 mmol/L (ref 96–106)
Creatinine, Ser: 1.42 mg/dL — ABNORMAL HIGH (ref 0.76–1.27)
GFR, EST AFRICAN AMERICAN: 58 mL/min/{1.73_m2} — AB (ref >59)
GFR, EST NON AFRICAN AMERICAN: 50 mL/min/{1.73_m2} — AB (ref >59)
GLOBULIN, TOTAL: 3.1 g/dL (ref 1.5–4.5)
Glucose: 109 mg/dL — ABNORMAL HIGH (ref 65–99)
Potassium: 4.4 mmol/L (ref 3.5–5.2)
Sodium: 140 mmol/L (ref 134–144)
Total Protein: 7.8 g/dL (ref 6.0–8.5)

## 2018-01-31 LAB — LIPID PANEL
CHOL/HDL RATIO: 3.8 ratio (ref 0.0–5.0)
Cholesterol, Total: 165 mg/dL (ref 100–199)
HDL: 44 mg/dL (ref >39)
LDL CALC: 87 mg/dL (ref 0–99)
Triglycerides: 171 mg/dL — ABNORMAL HIGH (ref 0–149)
VLDL CHOLESTEROL CAL: 34 mg/dL (ref 5–40)

## 2018-01-31 LAB — HEPATITIS C ANTIBODY

## 2018-02-03 ENCOUNTER — Ambulatory Visit: Payer: Medicare Other | Admitting: Family Medicine

## 2018-02-09 DIAGNOSIS — C44212 Basal cell carcinoma of skin of right ear and external auricular canal: Secondary | ICD-10-CM | POA: Diagnosis not present

## 2018-02-09 DIAGNOSIS — C44311 Basal cell carcinoma of skin of nose: Secondary | ICD-10-CM | POA: Diagnosis not present

## 2018-02-09 DIAGNOSIS — L570 Actinic keratosis: Secondary | ICD-10-CM | POA: Diagnosis not present

## 2018-02-09 DIAGNOSIS — X32XXXA Exposure to sunlight, initial encounter: Secondary | ICD-10-CM | POA: Diagnosis not present

## 2018-02-09 DIAGNOSIS — Z1283 Encounter for screening for malignant neoplasm of skin: Secondary | ICD-10-CM | POA: Diagnosis not present

## 2018-02-09 DIAGNOSIS — L820 Inflamed seborrheic keratosis: Secondary | ICD-10-CM | POA: Diagnosis not present

## 2018-03-03 ENCOUNTER — Other Ambulatory Visit: Payer: Self-pay | Admitting: Family Medicine

## 2018-03-03 DIAGNOSIS — F339 Major depressive disorder, recurrent, unspecified: Secondary | ICD-10-CM

## 2018-03-03 DIAGNOSIS — G4700 Insomnia, unspecified: Secondary | ICD-10-CM

## 2018-03-04 ENCOUNTER — Other Ambulatory Visit: Payer: Self-pay | Admitting: Family Medicine

## 2018-03-05 ENCOUNTER — Ambulatory Visit (INDEPENDENT_AMBULATORY_CARE_PROVIDER_SITE_OTHER): Payer: Medicare Other | Admitting: Family Medicine

## 2018-03-05 ENCOUNTER — Encounter: Payer: Self-pay | Admitting: Family Medicine

## 2018-03-05 VITALS — BP 136/79 | HR 62 | Temp 98.3°F | Ht 73.0 in | Wt 291.0 lb

## 2018-03-05 DIAGNOSIS — N183 Chronic kidney disease, stage 3 unspecified: Secondary | ICD-10-CM

## 2018-03-05 DIAGNOSIS — Z23 Encounter for immunization: Secondary | ICD-10-CM

## 2018-03-05 DIAGNOSIS — G4700 Insomnia, unspecified: Secondary | ICD-10-CM

## 2018-03-05 DIAGNOSIS — F339 Major depressive disorder, recurrent, unspecified: Secondary | ICD-10-CM

## 2018-03-05 MED ORDER — ESCITALOPRAM OXALATE 20 MG PO TABS: 20.0000 mg | ORAL_TABLET | Freq: Every day | ORAL | 2 refills | Status: DC

## 2018-03-05 NOTE — Progress Notes (Signed)
BP 136/79   Pulse 62   Temp 98.3 F (36.8 C) (Oral)   Ht _0  (1.854 m)   Wt 291 lb (132 kg)   BMI 38.39 kg/m    Subjective:    Patient ID: Martin Schultz, male    DOB: Aug 24, 1951, 67 y.o.   MRN: [id number]  HPI: Martin Schultz is a 67 y.o. male presenting on 03/05/2018 for Depression (follow up; complains of being too sleepy during the day, takes Lexapro in the morning and Mirtazapine at bedtime)   HPI Anxiety depression Patient comes in for recheck on anxiety and depression.  He says that his mood wise and anxiety has been a lot better and is getting out a lot more.  He denies any suicidal ideations or thoughts of hurting himself.  Says he has been sleeping a lot better but he is fatigued throughout the day and just feels tired and can fall asleep easily Depression screen [facility] 2/9 03/05/2018 01/30/2018 08/06/2017 05/16/2017  Decreased Interest 0 0 0 0  Down, Depressed, Hopeless 0 0 0 0  PHQ - 2 Score 0 0 0 0    Patient had elevated renal function again on this last lab draw, will do a nephrology referral  Relevant past medical, surgical, family and social history reviewed and updated as indicated. Interim medical history since our last visit reviewed. Allergies and medications reviewed and updated.  Review of Systems  Constitutional: Negative for chills and fever.  Respiratory: Negative for shortness of breath and wheezing.   Cardiovascular: Negative for chest pain and leg swelling.  Musculoskeletal: Negative for back pain and gait problem.  Skin: Negative for rash.  Psychiatric/Behavioral: Positive for dysphoric mood. Negative for decreased concentration, self-injury, sleep disturbance and suicidal ideas. The patient is nervous/anxious.   All other systems reviewed and are negative.   Per HPI unless specifically indicated above   Allergies as of 03/05/2018      Reactions   Trazodone And Nefazodone Other (See Comments)   Hallucination   Benadryl [diphenhydramine] Other (See  Comments)   Shakes, tremor      Medication List        Accurate as of 03/05/18 10:17 AM. Always use your most recent med list.          amLODipine 10 MG tablet Commonly known as:  NORVASC TAKE 1 TABLET EVERY MORNING FOR HIGH BLOOD PRESSURE.   aspirin EC 81 MG tablet Take 81 mg by mouth daily.   cetirizine 10 MG tablet Commonly known as:  ZYRTEC TAKE (1) TABLET BY MOUTH ONCE DAILY.   cholecalciferol 1000 units tablet Commonly known as:  VITAMIN D Take 1,000 Units by mouth daily.   escitalopram 20 MG tablet Commonly known as:  LEXAPRO TAKE (1) TABLET BY MOUTH ONCE DAILY.   fluticasone 50 MCG/ACT nasal spray Commonly known as:  FLONASE Place into both nostrils daily.   lisinopril-hydrochlorothiazide 20-25 MG tablet Commonly known as:  PRINZIDE,ZESTORETIC TAKE (1) TABLET BY MOUTH ONCE DAILY.   mirtazapine 15 MG tablet Commonly known as:  REMERON Take 1 tablet (15 mg total) by mouth at bedtime.   olopatadine 0.1 % ophthalmic solution Commonly known as:  PATANOL Place 1 drop into both eyes daily.   pravastatin 80 MG tablet Commonly known as:  PRAVACHOL TAKE 1 TABLET BY MOUTH AT BEDTIME.   propranolol 10 MG tablet Commonly known as:  INDERAL TAKE 1 TABLET TWICE DAILY TO HELP HEADACHE AND BLOOD PRESSURE   ranitidine 150 MG capsule  Commonly known as:  ZANTAC Take 150 mg by mouth 2 (two) times daily.   VENTOLIN HFA 108 (90 Base) MCG/ACT inhaler Generic drug:  albuterol INHALE 2 PUFFS 4 TIMES DAILY.          Objective:    BP 136/79   Pulse 62   Temp 98.3 F (36.8 C) (Oral)   Ht _0  (1.854 m)   Wt 291 lb (132 kg)   BMI 38.39 kg/m   Wt Readings from Last 3 Encounters:  03/05/18 291 lb (132 kg)  01/30/18 291 lb (132 kg)  08/06/17 290 lb (131.5 kg)    Physical Exam  Constitutional: He is oriented to person, place, and time. He appears well-developed and well-nourished. No distress.  Eyes: Conjunctivae are normal. No scleral icterus.  Neck: Neck  supple. No thyromegaly present.  Cardiovascular: Normal rate, regular rhythm, normal heart sounds and intact distal pulses.  No murmur heard. Pulmonary/Chest: Effort normal and breath sounds normal. No respiratory distress. He has no wheezes.  Musculoskeletal: Normal range of motion. He exhibits no edema.  Lymphadenopathy:    He has no cervical adenopathy.  Neurological: He is alert and oriented to person, place, and time. Coordination normal.  Skin: Skin is warm and dry. No rash noted. He is not diaphoretic.  Psychiatric: His behavior is normal. Judgment and thought content normal. His mood appears anxious. He exhibits a depressed mood. He expresses no suicidal ideation. He expresses no suicidal plans.  Nursing note and vitals reviewed.   Results for orders placed or performed in visit on 01/30/18  CMP14+EGFR  Result Value Ref Range   Glucose 109 (H) 65 - 99 mg/dL   BUN 11 8 - 27 mg/dL   Creatinine, Ser 1.42 (H) 0.76 - 1.27 mg/dL   GFR calc non Af Amer 50 (L) >59 mL/min/1.73   GFR calc Af Amer 58 (L) >59 mL/min/1.73   BUN/Creatinine Ratio 8 (L) 10 - 24   Sodium 140 134 - 144 mmol/L   Potassium 4.4 3.5 - 5.2 mmol/L   Chloride 100 96 - 106 mmol/L   CO2 24 20 - 29 mmol/L   Calcium 10.0 8.6 - 10.2 mg/dL   Total Protein 7.8 6.0 - 8.5 g/dL   Albumin 4.7 3.6 - 4.8 g/dL   Globulin, Total 3.1 1.5 - 4.5 g/dL   Albumin/Globulin Ratio 1.5 1.2 - 2.2   Bilirubin Total 0.9 0.0 - 1.2 mg/dL   Alkaline Phosphatase 60 39 - 117 IU/L   AST 24 0 - 40 IU/L   ALT 25 0 - 44 IU/L  CBC with Differential/Platelet  Result Value Ref Range   WBC 6.1 3.4 - 10.8 x10E3/uL   RBC 5.38 4.14 - 5.80 x10E6/uL   Hemoglobin 16.0 13.0 - 17.7 g/dL   Hematocrit 46.4 37.5 - 51.0 %   MCV 86 79 - 97 fL   MCH 29.7 26.6 - 33.0 pg   MCHC 34.5 31.5 - 35.7 g/dL   RDW 13.3 12.3 - 15.4 %   Platelets 315 150 - 379 x10E3/uL   Neutrophils 51 Not Estab. %   Lymphs 32 Not Estab. %   Monocytes 10 Not Estab. %   Eos 6 Not  Estab. %   Basos 1 Not Estab. %   Neutrophils Absolute 3.2 1.4 - 7.0 x10E3/uL   Lymphocytes Absolute 1.9 0.7 - 3.1 x10E3/uL   Monocytes Absolute 0.6 0.1 - 0.9 x10E3/uL   EOS (ABSOLUTE) 0.4 0.0 - 0.4 x10E3/uL   Basophils Absolute 0.1  0.0 - 0.2 x10E3/uL   Immature Granulocytes 0 Not Estab. %   Immature Grans (Abs) 0.0 0.0 - 0.1 x10E3/uL  Lipid panel  Result Value Ref Range   Cholesterol, Total 165 100 - 199 mg/dL   Triglycerides 171 (H) 0 - 149 mg/dL   HDL 44 >39 mg/dL   VLDL Cholesterol Cal 34 5 - 40 mg/dL   LDL Calculated 87 0 - 99 mg/dL   Chol/HDL Ratio 3.8 0.0 - 5.0 ratio  Hepatitis C antibody  Result Value Ref Range   Hep C Virus Ab <0.1 0.0 - 0.9 s/co ratio      Assessment & Plan:   Problem List Items Addressed This Visit      Other   Depression, recurrent (HCC)   Relevant Medications   escitalopram (LEXAPRO) 20 MG tablet   Insomnia - Primary   Relevant Medications   escitalopram (LEXAPRO) 20 MG tablet    Other Visit Diagnoses    Stage 3 chronic kidney disease ([place])       Relevant Orders   Ambulatory referral to Nephrology   Morbid obesity ([place])       BMI of 38 with hypertension     We will try moving the Lexapro to nighttime and see if he does better  Follow up plan: Return in about 2 months (around 05/05/2018), or if symptoms worsen or fail to improve, for Recheck depression.  Counseling provided for all of the vaccine components No orders of the defined types were placed in this encounter.   Caryl Pina, MD [facility] 03/05/2018, 10:17 AM

## 2018-03-16 ENCOUNTER — Other Ambulatory Visit: Payer: Self-pay | Admitting: Family Medicine

## 2018-03-19 DIAGNOSIS — I10 Essential (primary) hypertension: Secondary | ICD-10-CM | POA: Diagnosis not present

## 2018-03-19 DIAGNOSIS — R69 Illness, unspecified: Secondary | ICD-10-CM | POA: Diagnosis not present

## 2018-03-25 ENCOUNTER — Other Ambulatory Visit: Payer: Self-pay | Admitting: Family Medicine

## 2018-03-25 DIAGNOSIS — G4700 Insomnia, unspecified: Secondary | ICD-10-CM

## 2018-03-25 DIAGNOSIS — F339 Major depressive disorder, recurrent, unspecified: Secondary | ICD-10-CM

## 2018-03-27 ENCOUNTER — Other Ambulatory Visit: Payer: Self-pay | Admitting: Family Medicine

## 2018-03-31 ENCOUNTER — Other Ambulatory Visit: Payer: Self-pay | Admitting: Family Medicine

## 2018-04-20 NOTE — Addendum Note (Signed)
Addended by: Thana Ates on: 04/20/2018 10:41 AM   Modules accepted: Orders

## 2018-04-23 ENCOUNTER — Other Ambulatory Visit: Payer: Self-pay | Admitting: Family Medicine

## 2018-04-24 ENCOUNTER — Other Ambulatory Visit: Payer: Self-pay | Admitting: Family Medicine

## 2018-04-27 ENCOUNTER — Other Ambulatory Visit: Payer: Self-pay | Admitting: Family Medicine

## 2018-04-28 ENCOUNTER — Other Ambulatory Visit: Payer: Self-pay | Admitting: Family Medicine

## 2018-05-06 ENCOUNTER — Encounter: Payer: Self-pay | Admitting: Family Medicine

## 2018-05-06 ENCOUNTER — Ambulatory Visit (INDEPENDENT_AMBULATORY_CARE_PROVIDER_SITE_OTHER): Payer: Medicare Other | Admitting: Family Medicine

## 2018-05-06 VITALS — BP 139/82 | HR 55 | Temp 98.0°F | Ht 73.0 in | Wt 286.4 lb

## 2018-05-06 DIAGNOSIS — G4700 Insomnia, unspecified: Secondary | ICD-10-CM

## 2018-05-06 DIAGNOSIS — F339 Major depressive disorder, recurrent, unspecified: Secondary | ICD-10-CM

## 2018-05-06 MED ORDER — ESCITALOPRAM OXALATE 20 MG PO TABS: 20.0000 mg | ORAL_TABLET | Freq: Every day | ORAL | 3 refills | Status: DC

## 2018-05-06 MED ORDER — MIRTAZAPINE 15 MG PO TABS: 15.0000 mg | ORAL_TABLET | Freq: Every day | ORAL | 3 refills | Status: DC

## 2018-05-06 NOTE — Progress Notes (Signed)
BP 139/82   Pulse (!) 55   Temp 98 F (36.7 C) (Oral)   Ht _0  (1.854 m)   Wt 286 lb 6.4 oz (129.9 kg)   BMI 37.79 kg/m    Subjective:    Patient ID: Martin Schultz, male    DOB: 1951-09-15, 67 y.o.   MRN: [id number]  HPI: ROBERTSON COLCLOUGH is a 67 y.o. male presenting on 05/06/2018 for Depression (2 month follow up- Patient states he is doing really good except for he is sleeping too long at night-.)   HPI Depression and insomnia recheck Patient is coming in for depression and insomnia recheck.  He says that he is doing very well.  He said he was feeling a lot of tiredness initially but he switched the Lexapro to the evening and since he switched it to the evening he has been feeling a lot better.  He is very happy with the way he is feeling and how he is sleeping.  He denies any suicidal ideations or thoughts of hurting himself. Depression screen [facility] 2/9 05/06/2018 03/05/2018 01/30/2018 08/06/2017 05/16/2017  Decreased Interest 0 0 0 0 0  Down, Depressed, Hopeless 0 0 0 0 0  PHQ - 2 Score 0 0 0 0 0     Relevant past medical, surgical, family and social history reviewed and updated as indicated. Interim medical history since our last visit reviewed. Allergies and medications reviewed and updated.  Review of Systems  Constitutional: Negative for chills and fever.  Respiratory: Negative for shortness of breath and wheezing.   Cardiovascular: Negative for chest pain and leg swelling.  Musculoskeletal: Negative for back pain and gait problem.  Skin: Negative for rash.  Psychiatric/Behavioral: Negative for decreased concentration, dysphoric mood, self-injury, sleep disturbance and suicidal ideas. The patient is not nervous/anxious.   All other systems reviewed and are negative.   Per HPI unless specifically indicated above   Allergies as of 05/06/2018      Reactions   Trazodone And Nefazodone Other (See Comments)   Hallucination   Benadryl [diphenhydramine] Other (See Comments)   Shakes,  tremor      Medication List        Accurate as of 05/06/18 11:23 AM. Always use your most recent med list.          amLODipine 10 MG tablet Commonly known as:  NORVASC TAKE 1 TABLET EVERY MORNING FOR HIGH BLOOD PRESSURE.   aspirin EC 81 MG tablet Take 81 mg by mouth daily.   cetirizine 10 MG tablet Commonly known as:  ZYRTEC TAKE (1) TABLET BY MOUTH ONCE DAILY.   cholecalciferol 1000 units tablet Commonly known as:  VITAMIN D Take 1,000 Units by mouth daily.   escitalopram 20 MG tablet Commonly known as:  LEXAPRO Take 1 tablet (20 mg total) by mouth at bedtime.   fluticasone 50 MCG/ACT nasal spray Commonly known as:  FLONASE INSTILL 1 SPRAY EACH NOSTRIL ONCE DAILY.   lisinopril-hydrochlorothiazide 20-25 MG tablet Commonly known as:  PRINZIDE,ZESTORETIC TAKE (1) TABLET BY MOUTH ONCE DAILY.   mirtazapine 15 MG tablet Commonly known as:  REMERON Take 1 tablet (15 mg total) by mouth at bedtime.   olopatadine 0.1 % ophthalmic solution Commonly known as:  PATANOL INSTILL 1 DROP INTO AFFECTED TWICE DAILY.   pravastatin 80 MG tablet Commonly known as:  PRAVACHOL TAKE 1 TABLET BY MOUTH AT BEDTIME.   propranolol 10 MG tablet Commonly known as:  INDERAL TAKE 1 TABLET TWICE DAILY TO  HELP HEADACHE AND BLOOD PRESSURE   ranitidine 150 MG capsule Commonly known as:  ZANTAC Take 150 mg by mouth 2 (two) times daily.   VENTOLIN HFA 108 (90 Base) MCG/ACT inhaler Generic drug:  albuterol INHALE 2 PUFFS 4 TIMES DAILY.          Objective:    BP 139/82   Pulse (!) 55   Temp 98 F (36.7 C) (Oral)   Ht _0  (1.854 m)   Wt 286 lb 6.4 oz (129.9 kg)   BMI 37.79 kg/m   Wt Readings from Last 3 Encounters:  05/06/18 286 lb 6.4 oz (129.9 kg)  03/05/18 291 lb (132 kg)  01/30/18 291 lb (132 kg)    Physical Exam  Constitutional: He is oriented to person, place, and time. He appears well-developed and well-nourished. No distress.  Eyes: Conjunctivae are normal. No scleral  icterus.  Neck: Neck supple. No thyromegaly present.  Cardiovascular: Normal rate, regular rhythm, normal heart sounds and intact distal pulses.  No murmur heard. Pulmonary/Chest: Effort normal and breath sounds normal. No respiratory distress. He has no wheezes.  Musculoskeletal: Normal range of motion. He exhibits no edema.  Lymphadenopathy:    He has no cervical adenopathy.  Neurological: He is alert and oriented to person, place, and time. Coordination normal.  Skin: Skin is warm and dry. No rash noted. He is not diaphoretic.  Psychiatric: He has a normal mood and affect. His behavior is normal. Judgment and thought content normal.  Nursing note and vitals reviewed.   Results for orders placed or performed in visit on 01/30/18  CMP14+EGFR  Result Value Ref Range   Glucose 109 (H) 65 - 99 mg/dL   BUN 11 8 - 27 mg/dL   Creatinine, Ser 1.42 (H) 0.76 - 1.27 mg/dL   GFR calc non Af Amer 50 (L) >59 mL/min/1.73   GFR calc Af Amer 58 (L) >59 mL/min/1.73   BUN/Creatinine Ratio 8 (L) 10 - 24   Sodium 140 134 - 144 mmol/L   Potassium 4.4 3.5 - 5.2 mmol/L   Chloride 100 96 - 106 mmol/L   CO2 24 20 - 29 mmol/L   Calcium 10.0 8.6 - 10.2 mg/dL   Total Protein 7.8 6.0 - 8.5 g/dL   Albumin 4.7 3.6 - 4.8 g/dL   Globulin, Total 3.1 1.5 - 4.5 g/dL   Albumin/Globulin Ratio 1.5 1.2 - 2.2   Bilirubin Total 0.9 0.0 - 1.2 mg/dL   Alkaline Phosphatase 60 39 - 117 IU/L   AST 24 0 - 40 IU/L   ALT 25 0 - 44 IU/L  CBC with Differential/Platelet  Result Value Ref Range   WBC 6.1 3.4 - 10.8 x10E3/uL   RBC 5.38 4.14 - 5.80 x10E6/uL   Hemoglobin 16.0 13.0 - 17.7 g/dL   Hematocrit 46.4 37.5 - 51.0 %   MCV 86 79 - 97 fL   MCH 29.7 26.6 - 33.0 pg   MCHC 34.5 31.5 - 35.7 g/dL   RDW 13.3 12.3 - 15.4 %   Platelets 315 150 - 379 x10E3/uL   Neutrophils 51 Not Estab. %   Lymphs 32 Not Estab. %   Monocytes 10 Not Estab. %   Eos 6 Not Estab. %   Basos 1 Not Estab. %   Neutrophils Absolute 3.2 1.4 - 7.0  x10E3/uL   Lymphocytes Absolute 1.9 0.7 - 3.1 x10E3/uL   Monocytes Absolute 0.6 0.1 - 0.9 x10E3/uL   EOS (ABSOLUTE) 0.4 0.0 - 0.4 x10E3/uL  Basophils Absolute 0.1 0.0 - 0.2 x10E3/uL   Immature Granulocytes 0 Not Estab. %   Immature Grans (Abs) 0.0 0.0 - 0.1 x10E3/uL  Lipid panel  Result Value Ref Range   Cholesterol, Total 165 100 - 199 mg/dL   Triglycerides 171 (H) 0 - 149 mg/dL   HDL 44 >39 mg/dL   VLDL Cholesterol Cal 34 5 - 40 mg/dL   LDL Calculated 87 0 - 99 mg/dL   Chol/HDL Ratio 3.8 0.0 - 5.0 ratio  Hepatitis C antibody  Result Value Ref Range   Hep C Virus Ab <0.1 0.0 - 0.9 s/co ratio      Assessment & Plan:   Problem List Items Addressed This Visit      Other   Depression, recurrent (HCC) - Primary   Relevant Medications   mirtazapine (REMERON) 15 MG tablet   escitalopram (LEXAPRO) 20 MG tablet   Insomnia   Relevant Medications   mirtazapine (REMERON) 15 MG tablet   escitalopram (LEXAPRO) 20 MG tablet       Follow up plan: Return in about 4 months (around 09/05/2018), or if symptoms worsen or fail to improve, for Hypertension and cholesterol and depression recheck.  Counseling provided for all of the vaccine components No orders of the defined types were placed in this encounter.   Caryl Pina, MD [facility] 05/06/2018, 11:23 AM

## 2018-05-07 ENCOUNTER — Telehealth: Payer: Self-pay | Admitting: Family Medicine

## 2018-05-07 DIAGNOSIS — Z08 Encounter for follow-up examination after completed treatment for malignant neoplasm: Secondary | ICD-10-CM | POA: Diagnosis not present

## 2018-05-07 DIAGNOSIS — Z85828 Personal history of other malignant neoplasm of skin: Secondary | ICD-10-CM | POA: Diagnosis not present

## 2018-05-07 NOTE — Telephone Encounter (Signed)
Social worker is returning call for [employer]. She said to leave a message on her VM.

## 2018-05-11 NOTE — Telephone Encounter (Signed)
Spoke to Education officer, museum it was on a different patient.

## 2018-05-26 ENCOUNTER — Other Ambulatory Visit (HOSPITAL_COMMUNITY): Payer: Self-pay | Admitting: Medical

## 2018-05-26 DIAGNOSIS — N183 Chronic kidney disease, stage 3 unspecified: Secondary | ICD-10-CM

## 2018-06-10 ENCOUNTER — Ambulatory Visit (HOSPITAL_COMMUNITY)
Admission: RE | Admit: 2018-06-10 | Discharge: 2018-06-10 | Disposition: A | Discharge status: Home / Self Care | Payer: Medicare Other | Source: Ambulatory Visit | Attending: Medical | Admitting: Medical

## 2018-06-10 DIAGNOSIS — I10 Essential (primary) hypertension: Secondary | ICD-10-CM | POA: Diagnosis not present

## 2018-06-10 DIAGNOSIS — Z79899 Other long term (current) drug therapy: Secondary | ICD-10-CM | POA: Diagnosis not present

## 2018-06-10 DIAGNOSIS — N2889 Other specified disorders of kidney and ureter: Secondary | ICD-10-CM | POA: Diagnosis not present

## 2018-06-10 DIAGNOSIS — N183 Chronic kidney disease, stage 3 unspecified: Secondary | ICD-10-CM

## 2018-06-10 DIAGNOSIS — R809 Proteinuria, unspecified: Secondary | ICD-10-CM | POA: Diagnosis not present

## 2018-06-10 DIAGNOSIS — E559 Vitamin D deficiency, unspecified: Secondary | ICD-10-CM | POA: Diagnosis not present

## 2018-06-10 DIAGNOSIS — N281 Cyst of kidney, acquired: Secondary | ICD-10-CM | POA: Diagnosis not present

## 2018-06-10 DIAGNOSIS — D509 Iron deficiency anemia, unspecified: Secondary | ICD-10-CM | POA: Diagnosis not present

## 2018-06-10 DIAGNOSIS — Z1159 Encounter for screening for other viral diseases: Secondary | ICD-10-CM | POA: Diagnosis not present

## 2018-08-13 ENCOUNTER — Encounter: Payer: Self-pay | Admitting: *Deleted

## 2018-08-13 ENCOUNTER — Ambulatory Visit (INDEPENDENT_AMBULATORY_CARE_PROVIDER_SITE_OTHER): Payer: Medicare Other | Admitting: *Deleted

## 2018-08-13 VITALS — BP 140/69 | HR 50 | Ht 72.5 in | Wt 278.0 lb

## 2018-08-13 DIAGNOSIS — Z1211 Encounter for screening for malignant neoplasm of colon: Secondary | ICD-10-CM

## 2018-08-13 DIAGNOSIS — Z23 Encounter for immunization: Secondary | ICD-10-CM | POA: Diagnosis not present

## 2018-08-13 DIAGNOSIS — Z1212 Encounter for screening for malignant neoplasm of rectum: Principal | ICD-10-CM

## 2018-08-13 NOTE — Patient Instructions (Signed)
Martin Schultz , Thank you for taking time to come for your Medicare Wellness Visit. I appreciate your ongoing commitment to your health goals. Please review the following plan we discussed and let me know if I can assist you in the future.   These are the goals we discussed: Goals    . Blood Pressure < 140/90    . DIET - INCREASE WATER INTAKE    . Exercise 150 minutes per week (moderate activity)    . Weight (lb) < 200 lb (90.7 kg)       This is a list of the screening recommended for you and due dates:  Health Maintenance  Topic Date Due  . Cologuard (Stool DNA test)  01/07/1999  . Flu Shot  04/30/2018  . Tetanus Vaccine  03/05/2028  .  Hepatitis C: One time screening is recommended by Center for Disease Control  (CDC) for  adults born from [birth year] through 1967.   Completed  . Pneumonia vaccines  Completed   Recombinant Zoster (Shingles) Vaccine, RZV: What You Need to Know 1. Why get vaccinated? Shingles (also called herpes zoster, or just zoster) is a painful skin rash, often with blisters. Shingles is caused by the varicella zoster virus, the same virus that causes chickenpox. After you have chickenpox, the virus stays in your body and can cause shingles later in life. You can't catch shingles from another person. However, a person who has never had chickenpox (or chickenpox vaccine) could get chickenpox from someone with shingles. A shingles rash usually appears on one side of the face or body and heals within 2 to 4 weeks. Its main symptom is pain, which can be severe. Other symptoms can include fever, headache, chills and upset stomach. Very rarely, a shingles infection can lead to pneumonia, hearing problems, blindness, brain inflammation (encephalitis), or death. For about 1 person in 5, severe pain can continue even long after the rash has cleared up. This long-lasting pain is called post-herpetic neuralgia (PHN). Shingles is far more common in people 30 years of age and older than in  younger people, and the risk increases with age. It is also more common in people whose immune system is weakened because of a disease such as cancer, or by drugs such as steroids or chemotherapy. At least 1 million people a year in the [place] get shingles. 2. Shingles vaccine (recombinant) Recombinant shingles vaccine was approved by FDA in 2017 for the prevention of shingles. In clinical trials, it was more than 90% effective in preventing shingles. It can also reduce the likelihood of PHN. Two doses, 2 to 6 months apart, are recommended for adults 65 and older. This vaccine is also recommended for people who have already gotten the live shingles vaccine (Zostavax). There is no live virus in this vaccine. 3. Some people should not get this vaccine Tell your vaccine provider if you:  Have any severe, life-threatening allergies. A person who has ever had a life-threatening allergic reaction after a dose of recombinant shingles vaccine, or has a severe allergy to any component of this vaccine, may be advised not to be vaccinated. Ask your health care provider if you want information about vaccine components.  Are pregnant or breastfeeding. There is not much information about use of recombinant shingles vaccine in pregnant or nursing women. Your healthcare provider might recommend delaying vaccination.  Are not feeling well. If you have a mild illness, such as a cold, you can probably get the vaccine today. If you  are moderately or severely ill, you should probably wait until you recover. Your doctor can advise you.  4. Risks of a vaccine reaction With any medicine, including vaccines, there is a chance of reactions. After recombinant shingles vaccination, a person might experience:  Pain, redness, soreness, or swelling at the site of the injection  Headache, muscle aches, fever, shivering, fatigue  In clinical trials, most people got a sore arm with mild or moderate pain after  vaccination, and some also had redness and swelling where they got the shot. Some people felt tired, had muscle pain, a headache, shivering, fever, stomach pain, or nausea. About 1 out of 6 people who got recombinant zoster vaccine experienced side effects that prevented them from doing regular activities. Symptoms went away on their own in about 2 to 3 days. Side effects were more common in younger people. You should still get the second dose of recombinant zoster vaccine even if you had one of these reactions after the first dose. Other things that could happen after this vaccine:  People sometimes faint after medical procedures, including vaccination. Sitting or lying down for about 15 minutes can help prevent fainting and injuries caused by a fall. Tell your provider if you feel dizzy or have vision changes or ringing in the ears.  Some people get shoulder pain that can be more severe and longer-lasting than routine soreness that can follow injections. This happens very rarely.  Any medication can cause a severe allergic reaction. Such reactions to a vaccine are estimated at about 1 in a million doses, and would happen within a few minutes to a few hours after the vaccination. As with any medicine, there is a very remote chance of a vaccine causing a serious injury or death. The safety of vaccines is always being monitored. For more information, visit: http://www.aguilar.org/ 5. What if there is a serious problem? What should I look for?  Look for anything that concerns you, such as signs of a severe allergic reaction, very high fever, or unusual behavior. Signs of a severe allergic reaction can include hives, swelling of the face and throat, difficulty breathing, a fast heartbeat, dizziness, and weakness. These would usually start a few minutes to a few hours after the vaccination. What should I do?  If you think it is a severe allergic reaction or other emergency that can't wait, call  9-1-1 and get to the nearest hospital. Otherwise, call your health care provider. Afterward, the reaction should be reported to the Vaccine Adverse Event Reporting System (VAERS). Your doctor should file this report, or you can do it yourself through the VAERS web site atwww.vaers.https://www.bray.com/ by calling 832-656-3447. VAERS does not give medical advice. 6. How can I learn more?  Ask your healthcare provider. He or she can give you the vaccine package insert or suggest other sources of information.  Call your local or state health department.  Contact the Centers for Disease Control and Prevention (CDC): ? Call 248-168-4706 (1-800-CDC-INFO) or ? Visit the CDC's website at http://hunter.com/ CDC Vaccine Information Statement (VIS) Recombinant Zoster Vaccine (11/11/2016) This information is not intended to replace advice given to you by your health care provider. Make sure you discuss any questions you have with your health care provider. Document Released: 11/26/2016 Document Revised: 11/26/2016 Document Reviewed: 11/26/2016 Elsevier Interactive Patient Education  [employer].
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
## 2018-09-03 ENCOUNTER — Other Ambulatory Visit: Payer: Self-pay | Admitting: Family Medicine

## 2018-09-07 DIAGNOSIS — Z1212 Encounter for screening for malignant neoplasm of rectum: Secondary | ICD-10-CM | POA: Diagnosis not present

## 2018-09-07 DIAGNOSIS — Z1211 Encounter for screening for malignant neoplasm of colon: Secondary | ICD-10-CM | POA: Diagnosis not present

## 2018-09-09 LAB — COLOGUARD: Cologuard: NEGATIVE

## 2018-09-16 ENCOUNTER — Encounter: Payer: Self-pay | Admitting: Family Medicine

## 2018-09-16 ENCOUNTER — Ambulatory Visit (INDEPENDENT_AMBULATORY_CARE_PROVIDER_SITE_OTHER): Payer: Medicare Other | Admitting: Family Medicine

## 2018-09-16 VITALS — BP 145/82 | HR 63 | Temp 98.7°F | Ht 72.5 in | Wt 277.6 lb

## 2018-09-16 DIAGNOSIS — F339 Major depressive disorder, recurrent, unspecified: Secondary | ICD-10-CM | POA: Diagnosis not present

## 2018-09-16 DIAGNOSIS — K219 Gastro-esophageal reflux disease without esophagitis: Secondary | ICD-10-CM

## 2018-09-16 DIAGNOSIS — E782 Mixed hyperlipidemia: Secondary | ICD-10-CM | POA: Diagnosis not present

## 2018-09-16 DIAGNOSIS — I10 Essential (primary) hypertension: Secondary | ICD-10-CM

## 2018-09-16 DIAGNOSIS — J4520 Mild intermittent asthma, uncomplicated: Secondary | ICD-10-CM | POA: Diagnosis not present

## 2018-09-16 MED ORDER — OLOPATADINE HCL 0.1 % OP SOLN: OPHTHALMIC | 5 refills | Status: DC

## 2018-09-16 MED ORDER — PREDNISONE 20 MG PO TABS: ORAL_TABLET | ORAL | 0 refills | Status: DC

## 2018-09-16 MED ORDER — ALBUTEROL SULFATE HFA 108 (90 BASE) MCG/ACT IN AERS: INHALATION_SPRAY | RESPIRATORY_TRACT | 5 refills | Status: DC

## 2018-09-16 NOTE — Progress Notes (Signed)
BP (!) 145/82   Pulse 63   Temp 98.7 F (37.1 C) (Oral)   Ht 6' 0.5" (1.842 m)   Wt 277 lb 9.6 oz (125.9 kg)   SpO2 96%   BMI 37.13 kg/m    Subjective:    Patient ID: Martin Schultz, male    DOB: 07-19-1951, 67 y.o.   MRN: [id number]  HPI: Martin Schultz is a 67 y.o. male presenting on 09/16/2018 for Depression (4 month follow up); Hypertension; Hyperlipidemia; Nasal Congestion (x 1-2 weeks); and Cough   HPI Hypertension Patient is currently on amlodipine and propranolol and lisinopril hydrochlorothiazide, and their blood pressure today is 145/82. Patient denies any lightheadedness or dizziness. Patient denies headaches, blurred vision, chest pains, shortness of breath, or weakness. Denies any side effects from medication and is content with current medication.   GERD Patient is currently on Zantac.  She denies any major symptoms or abdominal pain or belching or burping. She denies any blood in her stool or lightheadedness or dizziness.   Hyperlipidemia Patient is coming in for recheck of his hyperlipidemia. The patient is currently taking pravastatin. They deny any issues with myalgias or history of liver damage from it. They deny any focal numbness or weakness or chest pain.   Depression Patient is coming in for recheck on depression.  Patient is currently on Lexapro and Remeron and feels like he is been doing well on these and is happy with the current dose.  He feels like his mood is been a lot better and his anxiety is been controlled.  He denies any suicidal ideations or thoughts of hurting himself.  Depression screen [facility] 2/9 09/16/2018 08/13/2018 05/06/2018 03/05/2018 01/30/2018  Decreased Interest 0 0 0 0 0  Down, Depressed, Hopeless 0 0 0 0 0  PHQ - 2 Score 0 0 0 0 0    Cough and congestion and wheezing Patient has asthma/COPD from years of smoking and has mild congestion and wheezing that is been a chronic issue.  Patient is currently on albuterol and feels like it is been  helping but is just more congestion over the past week than he has been typically previously.  He denies any fevers or chills.  Relevant past medical, surgical, family and social history reviewed and updated as indicated. Interim medical history since our last visit reviewed. Allergies and medications reviewed and updated.  Review of Systems  Constitutional: Negative for chills and fever.  HENT: Positive for congestion.   Eyes: Negative for visual disturbance.  Respiratory: Positive for cough, shortness of breath and wheezing.   Cardiovascular: Negative for chest pain and leg swelling.  Musculoskeletal: Negative for back pain and gait problem.  Skin: Negative for rash.  Neurological: Negative for dizziness, weakness, light-headedness and numbness.  Psychiatric/Behavioral: Positive for decreased concentration and dysphoric mood. Negative for self-injury, sleep disturbance and suicidal ideas. The patient is nervous/anxious.   All other systems reviewed and are negative.   Per HPI unless specifically indicated above   Allergies as of 09/16/2018      Reactions   Trazodone And Nefazodone Other (See Comments)   Hallucination   Benadryl [diphenhydramine] Other (See Comments)   Shakes, tremor      Medication List       Accurate as of September 16, 2018  1:23 PM. Always use your most recent med list.        albuterol 108 (90 Base) MCG/ACT inhaler Commonly known as:  VENTOLIN HFA INHALE 2 PUFFS 4 TIMES  DAILY.   amLODipine 10 MG tablet Commonly known as:  NORVASC TAKE 1 TABLET EVERY MORNING FOR HIGH BLOOD PRESSURE.   aspirin EC 81 MG tablet Take 81 mg by mouth daily.   cetirizine 10 MG tablet Commonly known as:  ZYRTEC TAKE (1) TABLET BY MOUTH ONCE DAILY.   cholecalciferol 1000 units tablet Commonly known as:  VITAMIN D Take 1,000 Units by mouth daily.   escitalopram 20 MG tablet Commonly known as:  LEXAPRO Take 1 tablet (20 mg total) by mouth at bedtime.   fluticasone  50 MCG/ACT nasal spray Commonly known as:  FLONASE INSTILL 1 SPRAY EACH NOSTRIL ONCE DAILY.   lisinopril-hydrochlorothiazide 20-25 MG tablet Commonly known as:  PRINZIDE,ZESTORETIC TAKE (1) TABLET BY MOUTH ONCE DAILY.   mirtazapine 15 MG tablet Commonly known as:  REMERON Take 1 tablet (15 mg total) by mouth at bedtime.   olopatadine 0.1 % ophthalmic solution Commonly known as:  PATANOL INSTILL 1 DROP INTO AFFECTED TWICE DAILY.   pravastatin 80 MG tablet Commonly known as:  PRAVACHOL TAKE 1 TABLET BY MOUTH AT BEDTIME.   predniSONE 20 MG tablet Commonly known as:  DELTASONE 2 po at same time daily for 5 days   propranolol 10 MG tablet Commonly known as:  INDERAL TAKE 1 TABLET TWICE DAILY TO HELP HEADACHE AND BLOOD PRESSURE   ranitidine 150 MG capsule Commonly known as:  ZANTAC Take 150 mg by mouth 2 (two) times daily.          Objective:    BP (!) 144/81   Pulse 63   Temp 98.7 F (37.1 C) (Oral)   Ht 6' 0.5" (1.842 m)   Wt 277 lb 9.6 oz (125.9 kg)   SpO2 96%   BMI 37.13 kg/m   Wt Readings from Last 3 Encounters:  09/16/18 277 lb 9.6 oz (125.9 kg)  08/13/18 278 lb (126.1 kg)  05/06/18 286 lb 6.4 oz (129.9 kg)    Physical Exam Vitals signs and nursing note reviewed.  Constitutional:      General: He is not in acute distress.    Appearance: He is well-developed. He is not diaphoretic.  Eyes:     General: No scleral icterus.       Right eye: No discharge.     Conjunctiva/sclera: Conjunctivae normal.     Pupils: Pupils are equal, round, and reactive to light.  Neck:     Musculoskeletal: Neck supple.     Thyroid: No thyromegaly.  Cardiovascular:     Rate and Rhythm: Normal rate and regular rhythm.     Heart sounds: Normal heart sounds. No murmur.  Pulmonary:     Effort: Pulmonary effort is normal. No respiratory distress.     Breath sounds: Normal breath sounds. No stridor. No wheezing, rhonchi or rales.  Musculoskeletal: Normal range of motion.    Lymphadenopathy:     Cervical: No cervical adenopathy.  Skin:    General: Skin is warm and dry.     Findings: No rash.  Neurological:     Mental Status: He is alert and oriented to person, place, and time.     Coordination: Coordination normal.  Psychiatric:        Mood and Affect: Mood is anxious and depressed.        Behavior: Behavior normal.        Thought Content: Thought content does not include suicidal ideation. Thought content does not include suicidal plan.         Assessment &  Plan:   Problem List Items Addressed This Visit      Cardiovascular and Mediastinum   Essential hypertension - Primary   Relevant Orders   CMP14+EGFR (Completed)     Respiratory   Asthma in adult, mild intermittent, uncomplicated   Relevant Medications   albuterol (VENTOLIN HFA) 108 (90 Base) MCG/ACT inhaler   predniSONE (DELTASONE) 20 MG tablet     Digestive   GERD without esophagitis   Relevant Orders   CBC with Differential/Platelet (Completed)     Other   HLD (hyperlipidemia)   Relevant Orders   Lipid panel (Completed)   Depression, recurrent (HCC)    Mild COPD exacerbation, will give prednisone  For GERD and hypertension and depression continue current medications. Follow up plan: Return in about 6 months (around 03/18/2019), or if symptoms worsen or fail to improve.  Counseling provided for all of the vaccine components Orders Placed This Encounter  Procedures  . CBC with Differential/Platelet  . CMP14+EGFR  . Lipid panel    Caryl Pina, MD [facility] 09/16/2018, 1:23 PM

## 2018-09-17 LAB — CBC WITH DIFFERENTIAL/PLATELET
BASOS: 1 %
Basophils Absolute: 0.1 10*3/uL (ref 0.0–0.2)
EOS (ABSOLUTE): 0.4 10*3/uL (ref 0.0–0.4)
EOS: 6 %
HEMATOCRIT: 45.9 % (ref 37.5–51.0)
Hemoglobin: 15.6 g/dL (ref 13.0–17.7)
IMMATURE GRANULOCYTES: 0 %
Immature Grans (Abs): 0 10*3/uL (ref 0.0–0.1)
LYMPHS: 39 %
Lymphocytes Absolute: 2.6 10*3/uL (ref 0.7–3.1)
MCH: 28.5 pg (ref 26.6–33.0)
MCHC: 34 g/dL (ref 31.5–35.7)
MCV: 84 fL (ref 79–97)
Monocytes Absolute: 0.6 10*3/uL (ref 0.1–0.9)
Monocytes: 9 %
NEUTROS PCT: 45 %
Neutrophils Absolute: 3 10*3/uL (ref 1.4–7.0)
PLATELETS: 364 10*3/uL (ref 150–450)
RBC: 5.47 x10E6/uL (ref 4.14–5.80)
RDW: 12.3 % (ref 12.3–15.4)
WBC: 6.8 10*3/uL (ref 3.4–10.8)

## 2018-09-17 LAB — CMP14+EGFR
A/G RATIO: 1.6 (ref 1.2–2.2)
ALT: 17 IU/L (ref 0–44)
AST: 19 IU/L (ref 0–40)
Albumin: 4.6 g/dL (ref 3.6–4.8)
Alkaline Phosphatase: 63 IU/L (ref 39–117)
BUN/Creatinine Ratio: 8 — ABNORMAL LOW (ref 10–24)
BUN: 12 mg/dL (ref 8–27)
Bilirubin Total: 0.9 mg/dL (ref 0.0–1.2)
CALCIUM: 9.4 mg/dL (ref 8.6–10.2)
CO2: 23 mmol/L (ref 20–29)
CREATININE: 1.48 mg/dL — AB (ref 0.76–1.27)
Chloride: 95 mmol/L — ABNORMAL LOW (ref 96–106)
GFR calc Af Amer: 55 mL/min/{1.73_m2} — ABNORMAL LOW (ref >59)
GFR, EST NON AFRICAN AMERICAN: 48 mL/min/{1.73_m2} — AB (ref >59)
Globulin, Total: 2.9 g/dL (ref 1.5–4.5)
Glucose: 101 mg/dL — ABNORMAL HIGH (ref 65–99)
POTASSIUM: 4.2 mmol/L (ref 3.5–5.2)
Sodium: 135 mmol/L (ref 134–144)
TOTAL PROTEIN: 7.5 g/dL (ref 6.0–8.5)

## 2018-09-17 LAB — LIPID PANEL
CHOL/HDL RATIO: 3.1 ratio (ref 0.0–5.0)
Cholesterol, Total: 148 mg/dL (ref 100–199)
HDL: 47 mg/dL (ref >39)
LDL CALC: 60 mg/dL (ref 0–99)
TRIGLYCERIDES: 207 mg/dL — AB (ref 0–149)
VLDL CHOLESTEROL CAL: 41 mg/dL — AB (ref 5–40)

## 2018-10-21 DIAGNOSIS — D509 Iron deficiency anemia, unspecified: Secondary | ICD-10-CM | POA: Diagnosis not present

## 2018-10-21 DIAGNOSIS — Z79899 Other long term (current) drug therapy: Secondary | ICD-10-CM | POA: Diagnosis not present

## 2018-10-21 DIAGNOSIS — I10 Essential (primary) hypertension: Secondary | ICD-10-CM | POA: Diagnosis not present

## 2018-10-21 DIAGNOSIS — R809 Proteinuria, unspecified: Secondary | ICD-10-CM | POA: Diagnosis not present

## 2018-10-21 DIAGNOSIS — E559 Vitamin D deficiency, unspecified: Secondary | ICD-10-CM | POA: Diagnosis not present

## 2018-10-21 DIAGNOSIS — N183 Chronic kidney disease, stage 3 (moderate): Secondary | ICD-10-CM | POA: Diagnosis not present

## 2018-10-27 DIAGNOSIS — I10 Essential (primary) hypertension: Secondary | ICD-10-CM | POA: Diagnosis not present

## 2018-10-27 DIAGNOSIS — N183 Chronic kidney disease, stage 3 (moderate): Secondary | ICD-10-CM | POA: Diagnosis not present

## 2018-10-27 DIAGNOSIS — R809 Proteinuria, unspecified: Secondary | ICD-10-CM | POA: Diagnosis not present

## 2018-10-27 DIAGNOSIS — D649 Anemia, unspecified: Secondary | ICD-10-CM | POA: Diagnosis not present

## 2018-11-06 IMAGING — US US RENAL
1 series · 14 of 25 positions shown · non-contrast
Comparison: CT scan of the abdomen dated 05/05/2007

CLINICAL DATA: Stage 3 chronic kidney disease.

EXAM:
RENAL / URINARY TRACT ULTRASOUND COMPLETE

[Series 1: us renal · 0.28mm/px · 14 of 42 slices shown]
[im 1/42]
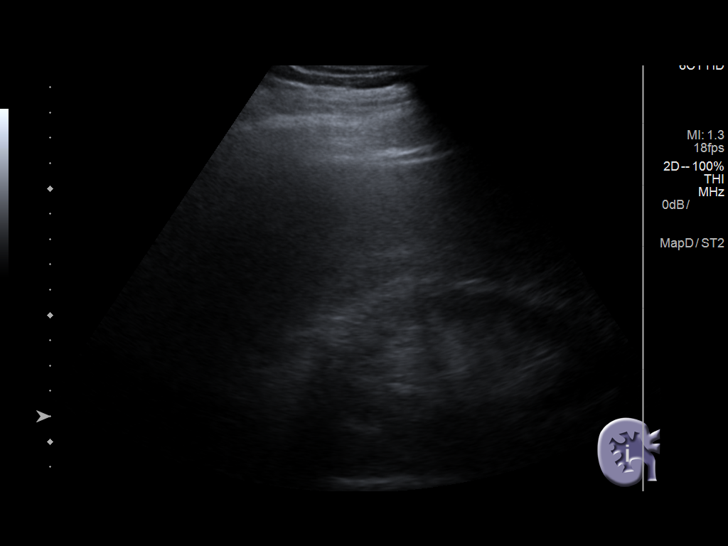
[im 4/42]
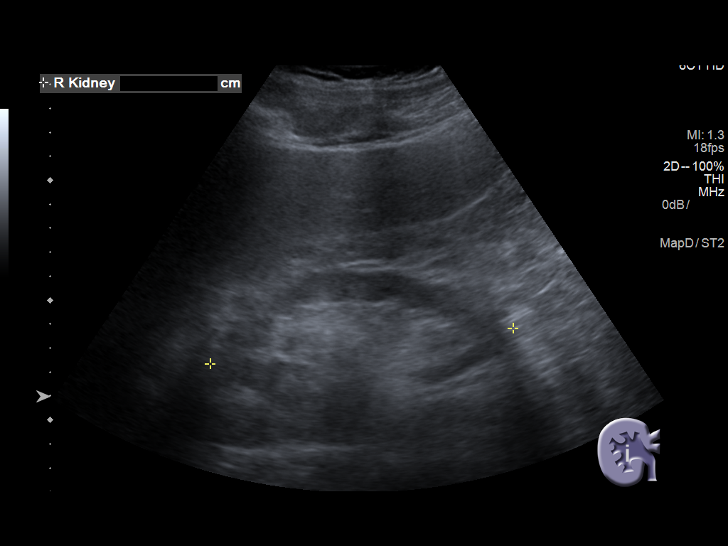
[im 7/42]
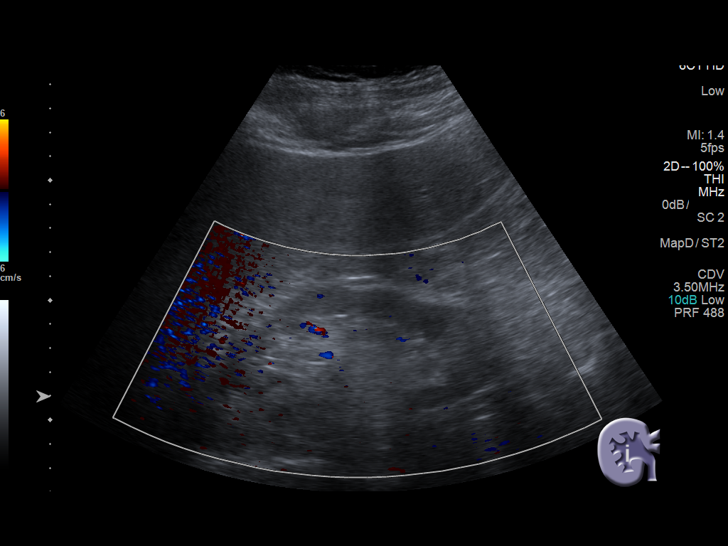
[im 11/42]
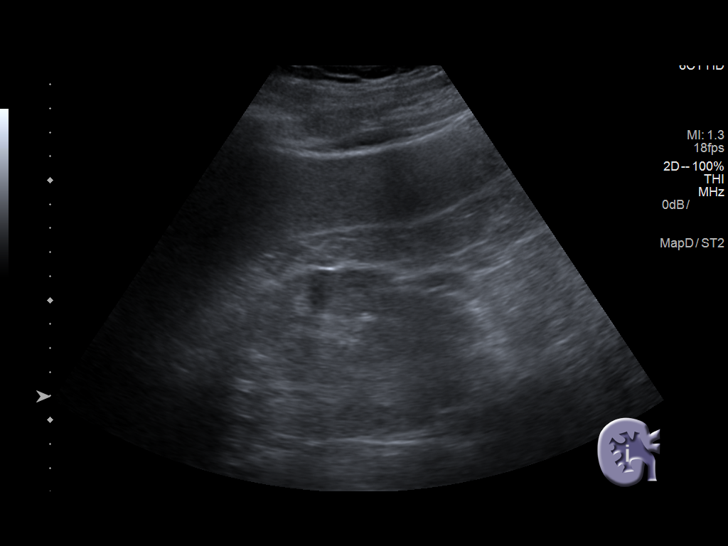
[im 14/42]
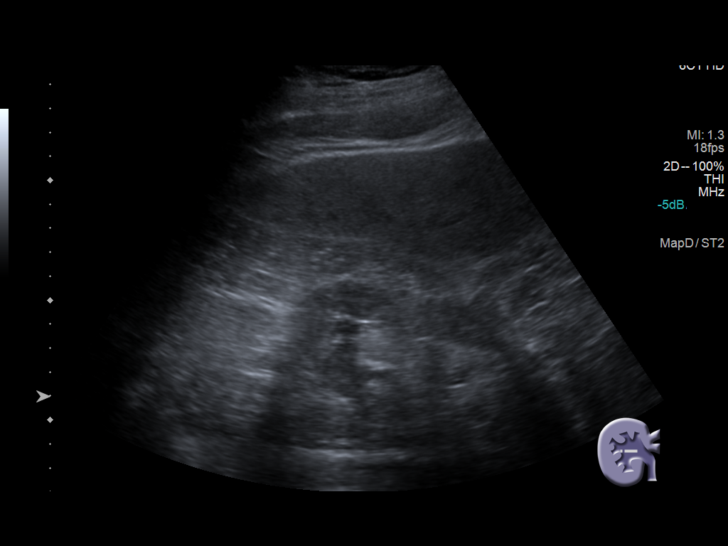
[im 16/42]
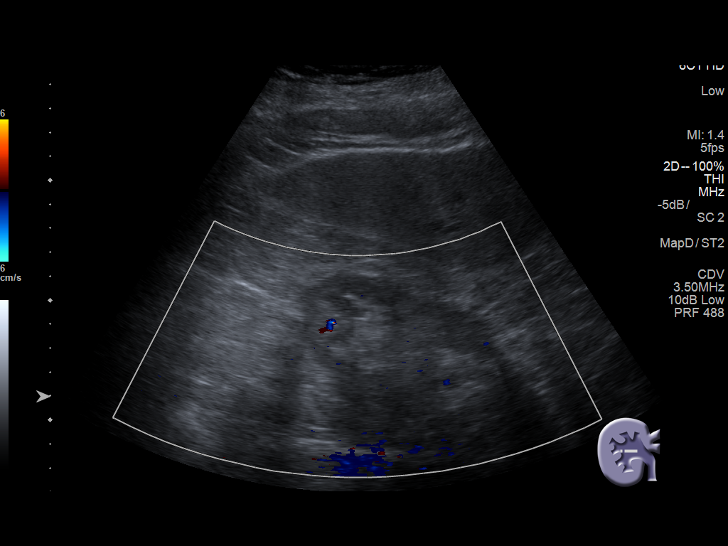
[im 19/42]
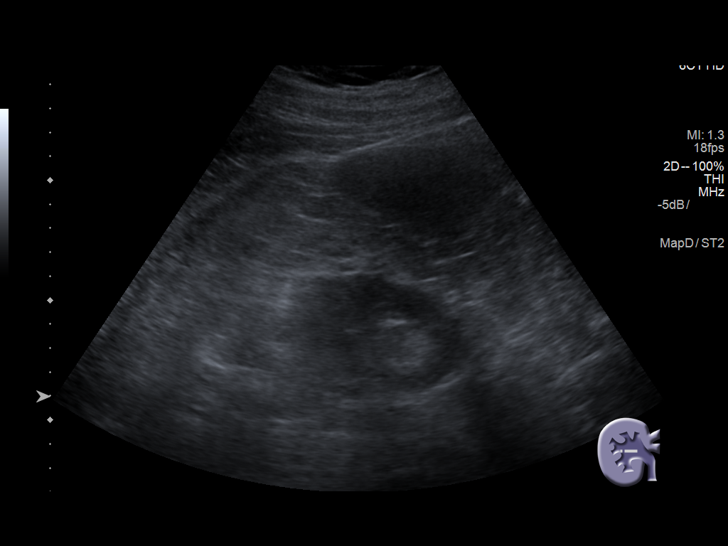
[im 23/42]
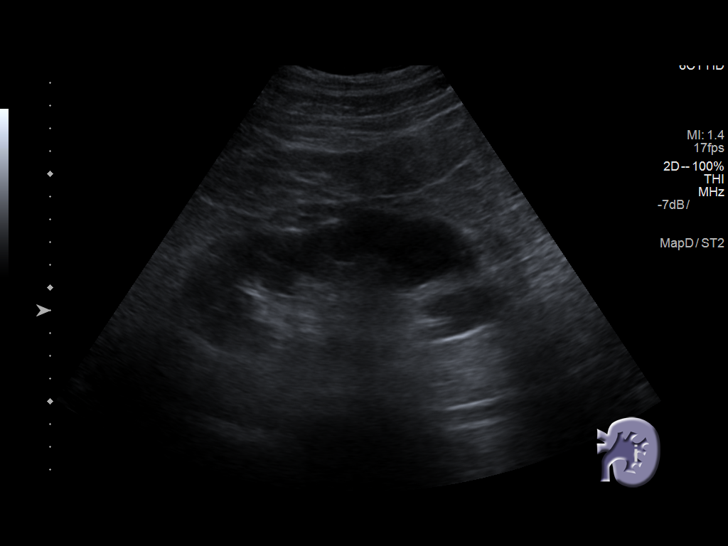
[im 26/42]
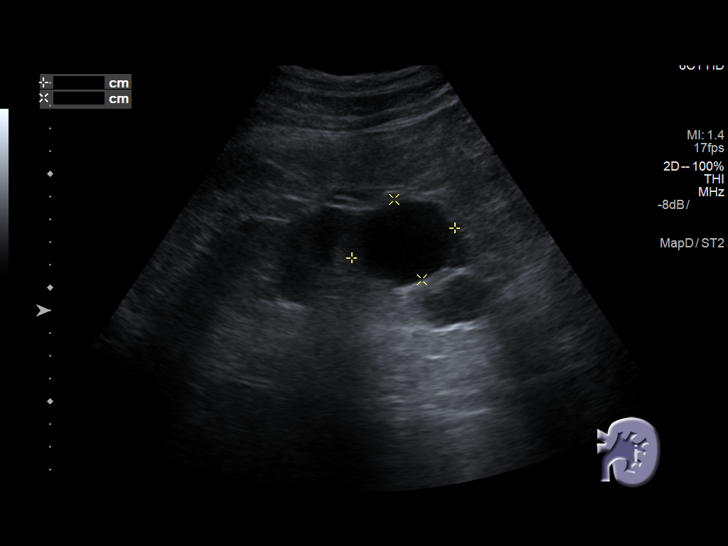
[im 28/42]
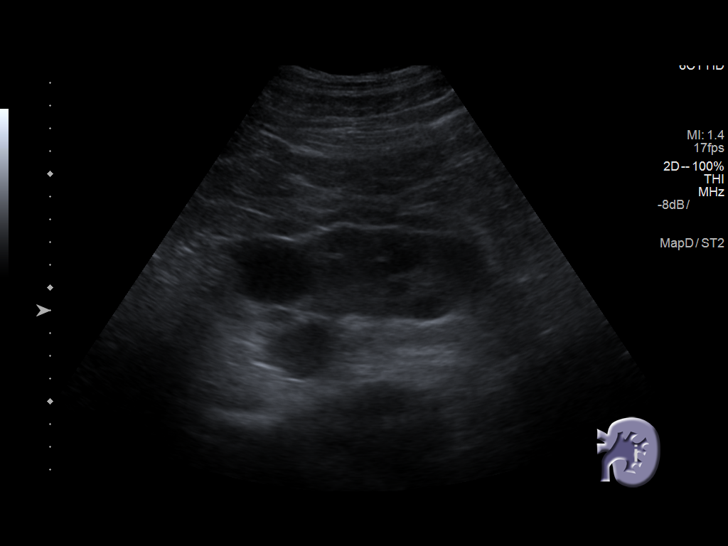
[im 31/42]
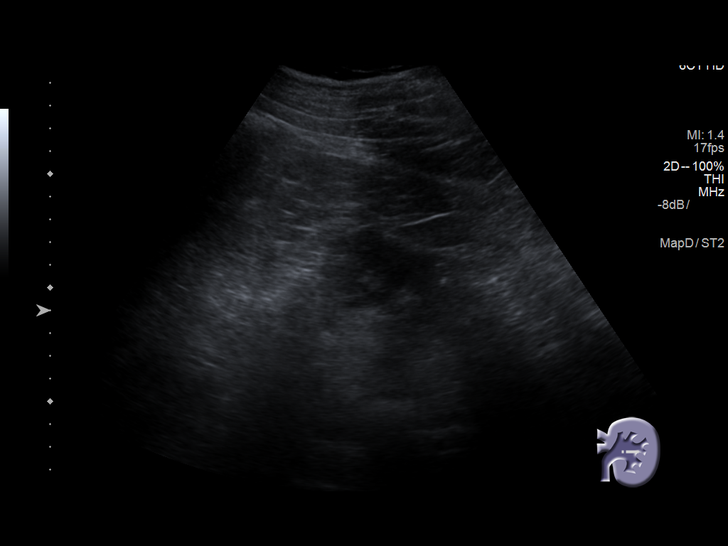
[im 35/42]
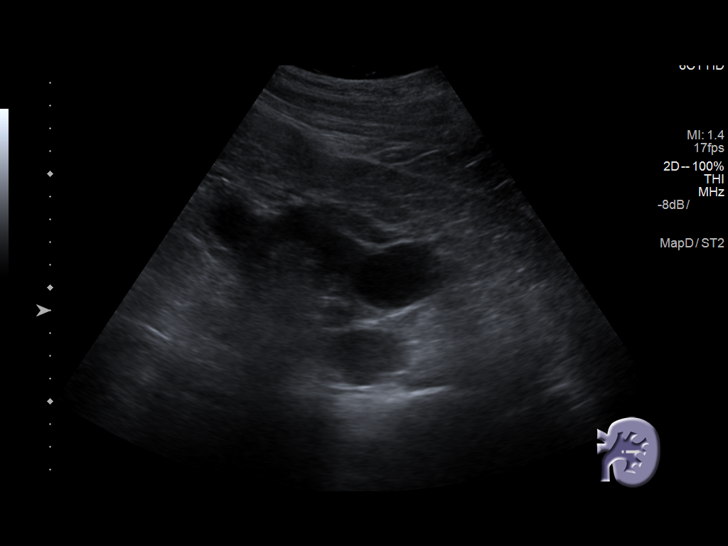
[im 38/42]
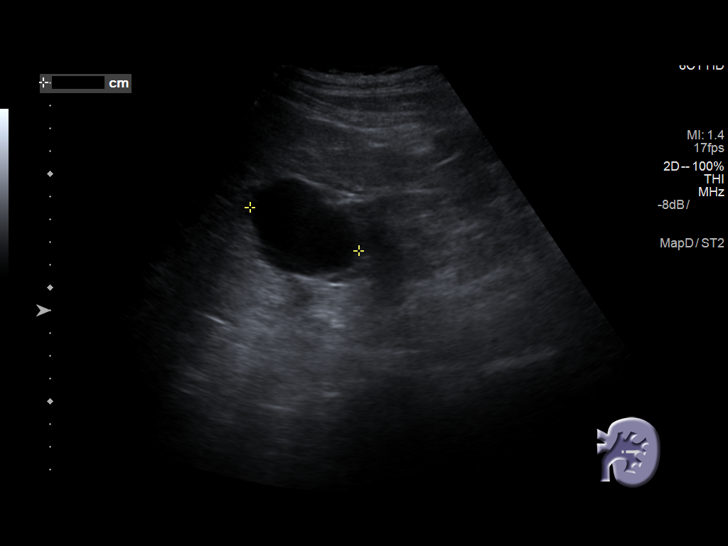
[im 42/42]
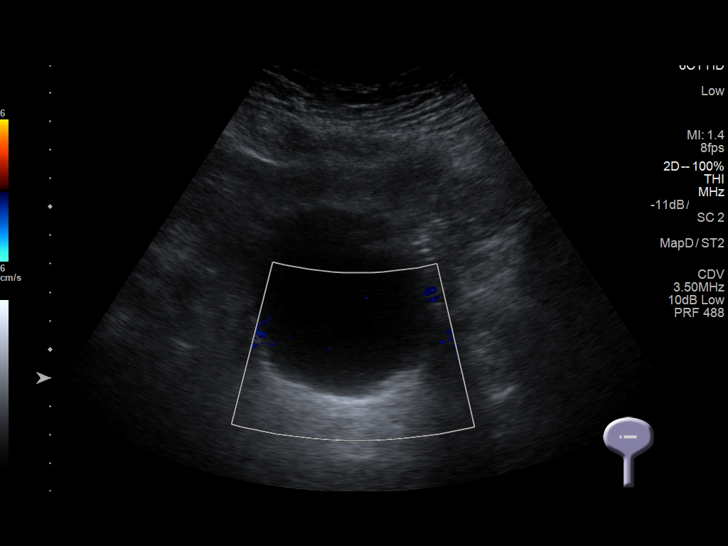

[14 of 25 positions shown; findings below may reference images not displayed]

FINDINGS: Right Kidney:

Length: 13.7 cm. Slight increased echogenicity of the renal
parenchyma. No mass or hydronephrosis visualized.

Left Kidney:

Length: 15.0 cm. Slightly increased echogenicity of the renal
parenchyma. Several renal cysts. The largest is 5.2 cm on the lower
pole. 4.3 cm cyst on the upper pole. No solid mass or hydronephrosis
visualized.

Bladder:

Appears normal for degree of bladder distention. Ureteral jets not
identified.
IMPRESSION: 1. Slight increased echogenicity of the renal parenchyma consistent
with renal medical disease.
2. Benign-appearing left renal cysts.

## 2018-12-04 ENCOUNTER — Other Ambulatory Visit: Payer: Self-pay | Admitting: Family Medicine

## 2019-01-08 ENCOUNTER — Other Ambulatory Visit: Payer: Self-pay | Admitting: Family Medicine

## 2019-01-26 ENCOUNTER — Telehealth: Payer: Self-pay

## 2019-01-26 MED ORDER — FAMOTIDINE 20 MG PO TABS: 20.0000 mg | ORAL_TABLET | Freq: Two times a day (BID) | ORAL | 3 refills | Status: DC

## 2019-01-26 NOTE — Addendum Note (Signed)
Addended by: Caryl Pina on: 01/26/2019 02:44 PM   Modules accepted: Orders

## 2019-01-26 NOTE — Telephone Encounter (Signed)
Changed patient to Pepcid because they have recalled all of the ranitidine

## 2019-01-26 NOTE — Telephone Encounter (Signed)
Danilelle from [facility] called about changing a medicine    [employer]

## 2019-02-01 ENCOUNTER — Other Ambulatory Visit: Payer: Self-pay | Admitting: Family Medicine

## 2019-02-11 ENCOUNTER — Other Ambulatory Visit: Payer: Self-pay | Admitting: Family Medicine

## 2019-03-18 ENCOUNTER — Ambulatory Visit (INDEPENDENT_AMBULATORY_CARE_PROVIDER_SITE_OTHER): Payer: Medicare Other | Admitting: Family Medicine

## 2019-03-18 ENCOUNTER — Other Ambulatory Visit: Payer: Self-pay

## 2019-03-18 ENCOUNTER — Encounter: Payer: Self-pay | Admitting: Family Medicine

## 2019-03-18 DIAGNOSIS — F339 Major depressive disorder, recurrent, unspecified: Secondary | ICD-10-CM | POA: Diagnosis not present

## 2019-03-18 DIAGNOSIS — K219 Gastro-esophageal reflux disease without esophagitis: Secondary | ICD-10-CM

## 2019-03-18 DIAGNOSIS — G4700 Insomnia, unspecified: Secondary | ICD-10-CM

## 2019-03-18 DIAGNOSIS — I10 Essential (primary) hypertension: Secondary | ICD-10-CM | POA: Diagnosis not present

## 2019-03-18 MED ORDER — LISINOPRIL-HYDROCHLOROTHIAZIDE 20-25 MG PO TABS: 1.0000 | ORAL_TABLET | Freq: Every day | ORAL | 3 refills | Status: DC

## 2019-03-18 MED ORDER — MIRTAZAPINE 15 MG PO TABS: 15.0000 mg | ORAL_TABLET | Freq: Every day | ORAL | 3 refills | Status: DC

## 2019-03-18 MED ORDER — OLOPATADINE HCL 0.1 % OP SOLN: OPHTHALMIC | 5 refills | Status: DC

## 2019-03-18 MED ORDER — PREDNISONE 20 MG PO TABS: ORAL_TABLET | ORAL | 0 refills | Status: DC

## 2019-03-18 MED ORDER — PRAVASTATIN SODIUM 80 MG PO TABS: 80.0000 mg | ORAL_TABLET | Freq: Every day | ORAL | 3 refills | Status: DC

## 2019-03-18 MED ORDER — AMLODIPINE BESYLATE 10 MG PO TABS: 10.0000 mg | ORAL_TABLET | Freq: Every day | ORAL | 3 refills | Status: DC

## 2019-03-18 MED ORDER — PROPRANOLOL HCL 10 MG PO TABS: 10.0000 mg | ORAL_TABLET | Freq: Two times a day (BID) | ORAL | 3 refills | Status: DC

## 2019-03-18 MED ORDER — ESCITALOPRAM OXALATE 20 MG PO TABS: 20.0000 mg | ORAL_TABLET | Freq: Every day | ORAL | 3 refills | Status: DC

## 2019-03-18 NOTE — Progress Notes (Signed)
Virtual Visit via telephone Note  I connected with Martin Schultz on 03/18/19 at 1421 by telephone and verified that I am speaking with the correct person using two identifiers. Martin Schultz is currently located at home and daughter are currently with her during visit. The provider, Fransisca Kaufmann Dettinger, MD is located in their office at time of visit.  Call ended at 1431  I discussed the limitations, risks, security and privacy concerns of performing an evaluation and management service by telephone and the availability of in person appointments. I also discussed with the patient that there may be a patient responsible charge related to this service. The patient expressed understanding and agreed to proceed.   History and Present Illness: Hypertension Patient is currently on lisinopril and hctz and propanolol and amlodipine, and their blood pressure today is unknown. Patient denies any lightheadedness or dizziness. Patient denies headaches, blurred vision, chest pains, shortness of breath, or weakness. Denies any side effects from medication and is content with current medication.   Patient says he is sleeping well and denies anxiety. Patient is taking lexapro and remeron.   Hyperlipidemia Patient is coming in for recheck of his hyperlipidemia. The patient is currently taking pravastatin. They deny any issues with myalgias or history of liver damage from it. They deny any focal numbness or weakness or chest pain.   GERD Patient is currently on famotidine.  She denies any major symptoms or abdominal pain or belching or burping. She denies any blood in her stool or lightheadedness or dizziness.   Outpatient Encounter Medications as of 03/18/2019  Medication Sig  . albuterol (VENTOLIN HFA) 108 (90 Base) MCG/ACT inhaler INHALE 2 PUFFS 4 TIMES DAILY.  Marland Kitchen amLODipine (NORVASC) 10 MG tablet TAKE 1 TABLET EVERY MORNING FOR HIGH BLOOD PRESSURE.  Marland Kitchen aspirin EC 81 MG tablet Take 81 mg by mouth daily.  .  cetirizine (ZYRTEC) 10 MG tablet TAKE (1) TABLET BY MOUTH ONCE DAILY.  . cholecalciferol (VITAMIN D) 1000 units tablet Take 1,000 Units by mouth daily.  Marland Kitchen escitalopram (LEXAPRO) 20 MG tablet Take 1 tablet (20 mg total) by mouth at bedtime.  . famotidine (PEPCID) 20 MG tablet Take 1 tablet (20 mg total) by mouth 2 (two) times daily.  . fluticasone (FLONASE) 50 MCG/ACT nasal spray INSTILL 1 SPRAY EACH NOSTRIL ONCE DAILY.  Marland Kitchen lisinopril-hydrochlorothiazide (PRINZIDE,ZESTORETIC) 20-25 MG tablet TAKE (1) TABLET BY MOUTH ONCE DAILY.  . mirtazapine (REMERON) 15 MG tablet Take 1 tablet (15 mg total) by mouth at bedtime.  Marland Kitchen olopatadine (PATANOL) 0.1 % ophthalmic solution INSTILL 1 DROP INTO AFFECTED TWICE DAILY.  . pravastatin (PRAVACHOL) 80 MG tablet TAKE 1 TABLET BY MOUTH AT BEDTIME.  . predniSONE (DELTASONE) 20 MG tablet 2 po at same time daily for 5 days  . propranolol (INDERAL) 10 MG tablet TAKE 1 TABLET TWICE DAILY TO HELP HEADACHE AND BLOOD PRESSURE  . [DISCONTINUED] olopatadine (PATANOL) 0.1 % ophthalmic solution INSTILL 1 DROP INTO AFFECTED TWICE DAILY.   No facility-administered encounter medications on file as of 03/18/2019.     Review of Systems  Constitutional: Negative for chills and fever.  HENT: Positive for congestion.   Eyes: Negative for discharge.  Respiratory: Positive for cough. Negative for shortness of breath and wheezing.   Cardiovascular: Negative for chest pain and leg swelling.  Musculoskeletal: Negative for back pain and gait problem.  Skin: Negative for rash.  Neurological: Negative for dizziness, weakness, numbness and headaches.  All other systems reviewed and are negative.  Observations/Objective: Patient sounds comfortable and in no acute distress  Assessment and Plan: Problem List Items Addressed This Visit      Cardiovascular and Mediastinum   Essential hypertension - Primary   Relevant Medications   amLODipine (NORVASC) 10 MG tablet    lisinopril-hydrochlorothiazide (ZESTORETIC) 20-25 MG tablet   pravastatin (PRAVACHOL) 80 MG tablet   propranolol (INDERAL) 10 MG tablet     Digestive   GERD without esophagitis     Other   Depression, recurrent (HCC)   Relevant Medications   escitalopram (LEXAPRO) 20 MG tablet   mirtazapine (REMERON) 15 MG tablet   Insomnia   Relevant Medications   escitalopram (LEXAPRO) 20 MG tablet   mirtazapine (REMERON) 15 MG tablet       Follow Up Instructions:  Continue current medications, it sounds like he is doing well no changes, he has a little bit of congestion with his asthma starting to flareup of the allergies and would like a little bit of prednisone to help with that.  He denies any fevers or chills.  Follow-up in 3 months   I discussed the assessment and treatment plan with the patient. The patient was provided an opportunity to ask questions and all were answered. The patient agreed with the plan and demonstrated an understanding of the instructions.   The patient was advised to call back or seek an in-person evaluation if the symptoms worsen or if the condition fails to improve as anticipated.  The above assessment and management plan was discussed with the patient. The patient verbalized understanding of and has agreed to the management plan. Patient is aware to call the clinic if symptoms persist or worsen. Patient is aware when to return to the clinic for a follow-up visit. Patient educated on when it is appropriate to go to the emergency department.    I provided 10 minutes of non-face-to-face time during this encounter.    Worthy Rancher, MD

## 2019-04-01 ENCOUNTER — Other Ambulatory Visit: Payer: Self-pay | Admitting: Family Medicine

## 2019-04-14 ENCOUNTER — Other Ambulatory Visit: Payer: Self-pay | Admitting: Family Medicine

## 2019-05-04 DIAGNOSIS — D649 Anemia, unspecified: Secondary | ICD-10-CM | POA: Diagnosis not present

## 2019-05-04 DIAGNOSIS — E559 Vitamin D deficiency, unspecified: Secondary | ICD-10-CM | POA: Diagnosis not present

## 2019-05-04 DIAGNOSIS — Z79899 Other long term (current) drug therapy: Secondary | ICD-10-CM | POA: Diagnosis not present

## 2019-05-04 DIAGNOSIS — N183 Chronic kidney disease, stage 3 (moderate): Secondary | ICD-10-CM | POA: Diagnosis not present

## 2019-05-04 DIAGNOSIS — R809 Proteinuria, unspecified: Secondary | ICD-10-CM | POA: Diagnosis not present

## 2019-05-04 DIAGNOSIS — I10 Essential (primary) hypertension: Secondary | ICD-10-CM | POA: Diagnosis not present

## 2019-05-27 DIAGNOSIS — E663 Overweight: Secondary | ICD-10-CM | POA: Insufficient documentation

## 2019-05-27 DIAGNOSIS — M898X9 Other specified disorders of bone, unspecified site: Secondary | ICD-10-CM | POA: Insufficient documentation

## 2019-05-27 DIAGNOSIS — N183 Chronic kidney disease, stage 3 unspecified: Secondary | ICD-10-CM | POA: Insufficient documentation

## 2019-05-27 DIAGNOSIS — R801 Persistent proteinuria, unspecified: Secondary | ICD-10-CM | POA: Insufficient documentation

## 2019-07-21 DIAGNOSIS — L570 Actinic keratosis: Secondary | ICD-10-CM | POA: Diagnosis not present

## 2019-07-21 DIAGNOSIS — L820 Inflamed seborrheic keratosis: Secondary | ICD-10-CM | POA: Diagnosis not present

## 2019-07-21 DIAGNOSIS — D0462 Carcinoma in situ of skin of left upper limb, including shoulder: Secondary | ICD-10-CM | POA: Diagnosis not present

## 2019-07-21 DIAGNOSIS — B078 Other viral warts: Secondary | ICD-10-CM | POA: Diagnosis not present

## 2019-07-21 DIAGNOSIS — X32XXXD Exposure to sunlight, subsequent encounter: Secondary | ICD-10-CM | POA: Diagnosis not present

## 2019-09-15 ENCOUNTER — Other Ambulatory Visit: Payer: Self-pay | Admitting: Family Medicine

## 2019-10-13 ENCOUNTER — Other Ambulatory Visit: Payer: Self-pay | Admitting: Family Medicine

## 2019-10-15 ENCOUNTER — Other Ambulatory Visit: Payer: Self-pay | Admitting: Family Medicine

## 2019-10-27 DIAGNOSIS — N1831 Chronic kidney disease, stage 3a: Secondary | ICD-10-CM | POA: Diagnosis not present

## 2019-10-27 DIAGNOSIS — E559 Vitamin D deficiency, unspecified: Secondary | ICD-10-CM | POA: Diagnosis not present

## 2019-10-27 DIAGNOSIS — D631 Anemia in chronic kidney disease: Secondary | ICD-10-CM | POA: Diagnosis not present

## 2019-10-27 DIAGNOSIS — R809 Proteinuria, unspecified: Secondary | ICD-10-CM | POA: Diagnosis not present

## 2019-10-27 DIAGNOSIS — Z79899 Other long term (current) drug therapy: Secondary | ICD-10-CM | POA: Diagnosis not present

## 2019-10-27 DIAGNOSIS — I10 Essential (primary) hypertension: Secondary | ICD-10-CM | POA: Diagnosis not present

## 2019-11-05 DIAGNOSIS — I10 Essential (primary) hypertension: Secondary | ICD-10-CM | POA: Diagnosis not present

## 2019-11-05 DIAGNOSIS — Z6841 Body Mass Index (BMI) 40.0 and over, adult: Secondary | ICD-10-CM | POA: Diagnosis not present

## 2019-11-05 DIAGNOSIS — N1831 Chronic kidney disease, stage 3a: Secondary | ICD-10-CM | POA: Diagnosis not present

## 2019-11-05 DIAGNOSIS — E559 Vitamin D deficiency, unspecified: Secondary | ICD-10-CM | POA: Diagnosis not present

## 2019-11-05 DIAGNOSIS — R809 Proteinuria, unspecified: Secondary | ICD-10-CM | POA: Diagnosis not present

## 2019-12-14 ENCOUNTER — Other Ambulatory Visit: Payer: Self-pay | Admitting: Family Medicine

## 2019-12-17 ENCOUNTER — Telehealth: Payer: Self-pay | Admitting: Family Medicine

## 2019-12-20 ENCOUNTER — Ambulatory Visit (INDEPENDENT_AMBULATORY_CARE_PROVIDER_SITE_OTHER): Payer: Medicare Other | Admitting: Family Medicine

## 2019-12-20 DIAGNOSIS — Z5989 Other problems related to housing and economic circumstances: Secondary | ICD-10-CM

## 2019-12-20 DIAGNOSIS — Z602 Problems related to living alone: Secondary | ICD-10-CM | POA: Diagnosis not present

## 2019-12-20 DIAGNOSIS — Z598 Other problems related to housing and economic circumstances: Secondary | ICD-10-CM

## 2019-12-20 DIAGNOSIS — Z7189 Other specified counseling: Secondary | ICD-10-CM | POA: Diagnosis not present

## 2019-12-20 NOTE — Progress Notes (Signed)
Virtual Visit via telephone Note  I connected with Martin Schultz on 12/20/19 at 1154 by telephone and verified that I am speaking with the correct person using two identifiers. Martin Schultz is currently located at [place] and Education officer, museum from [place], Toniann Ket are currently with her during visit. The provider, Fransisca Kaufmann Keller Bounds, MD is located in their office at time of visit.  Call ended at 1200  I discussed the limitations, risks, security and privacy concerns of performing an evaluation and management service by telephone and the availability of in person appointments. I also discussed with the patient that there may be a patient responsible charge related to this service. The patient expressed understanding and agreed to proceed.   History and Present Illness: Spoke with patient's social worker from [employer] and they said they basically need an FL 2 stating that he is appropriate for home and wants to do independent care and that they need an FL 2 for this, insulin that we have done previously and we will continue with that.  She says she would just like to continue that and keep going and just wanted to explain the situation.  1. Person living alone   2. Living accommodation issues     Outpatient Encounter Medications as of 12/20/2019  Medication Sig  . albuterol (VENTOLIN HFA) 108 (90 Base) MCG/ACT inhaler INHALE 2 PUFFS 4 TIMES DAILY.  Marland Kitchen amLODipine (NORVASC) 10 MG tablet Take 1 tablet (10 mg total) by mouth daily.  Marland Kitchen aspirin EC 81 MG tablet Take 81 mg by mouth daily.  . cetirizine (ZYRTEC) 10 MG tablet TAKE (1) TABLET BY MOUTH ONCE DAILY.  . cholecalciferol (VITAMIN D) 1000 units tablet Take 1,000 Units by mouth daily.  Marland Kitchen escitalopram (LEXAPRO) 20 MG tablet Take 1 tablet (20 mg total) by mouth at bedtime.  . famotidine (PEPCID) 20 MG tablet Take 1 tablet (20 mg total) by mouth 2 (two) times daily. Patient needs an appointment before next refill.  . fluticasone  (FLONASE) 50 MCG/ACT nasal spray INSTILL 1 SPRAY EACH NOSTRIL ONCE DAILY.  Marland Kitchen lisinopril-hydrochlorothiazide (ZESTORETIC) 20-25 MG tablet Take 1 tablet by mouth daily.  . mirtazapine (REMERON) 15 MG tablet Take 1 tablet (15 mg total) by mouth at bedtime.  Marland Kitchen olopatadine (PATANOL) 0.1 % ophthalmic solution INSTILL 1 DROP INTO AFFECTED EYE (S) TWICE DAILY.  . pravastatin (PRAVACHOL) 80 MG tablet Take 1 tablet (80 mg total) by mouth at bedtime.  . predniSONE (DELTASONE) 20 MG tablet 2 po at same time daily for 5 days  . propranolol (INDERAL) 10 MG tablet Take 1 tablet (10 mg total) by mouth 2 (two) times daily.   No facility-administered encounter medications on file as of 12/20/2019.    Review of Systems  Constitutional: Negative for chills and fever.  Respiratory: Negative for shortness of breath and wheezing.   Cardiovascular: Negative for chest pain and leg swelling.  Musculoskeletal: Negative for back pain and gait problem.  Skin: Negative for rash.  All other systems reviewed and are negative.   Observations/Objective: Forde Dandy was with patient Education officer, museum for the most part  Assessment and Plan: Problem List Items Addressed This Visit    None    Visit Diagnoses    Person living alone    -  Primary   Living accommodation issues          Patient just needs a reup on his FL 2 saying that he can live at home and that he  is safe to live at home and that he does take care of himself.  Patient social worker is in agreement with this Follow up plan: Return if symptoms worsen or fail to improve.     I discussed the assessment and treatment plan with the patient. The patient was provided an opportunity to ask questions and all were answered. The patient agreed with the plan and demonstrated an understanding of the instructions.   The patient was advised to call back or seek an in-person evaluation if the symptoms worsen or if the condition fails to improve as anticipated.  The  above assessment and management plan was discussed with the patient. The patient verbalized understanding of and has agreed to the management plan. Patient is aware to call the clinic if symptoms persist or worsen. Patient is aware when to return to the clinic for a follow-up visit. Patient educated on when it is appropriate to go to the emergency department.    I provided 6 minutes of non-face-to-face time during this encounter.    Worthy Rancher, MD

## 2020-02-04 DIAGNOSIS — E559 Vitamin D deficiency, unspecified: Secondary | ICD-10-CM | POA: Diagnosis not present

## 2020-02-04 DIAGNOSIS — N1831 Chronic kidney disease, stage 3a: Secondary | ICD-10-CM | POA: Diagnosis not present

## 2020-02-04 DIAGNOSIS — D509 Iron deficiency anemia, unspecified: Secondary | ICD-10-CM | POA: Diagnosis not present

## 2020-02-04 DIAGNOSIS — R809 Proteinuria, unspecified: Secondary | ICD-10-CM | POA: Diagnosis not present

## 2020-02-04 DIAGNOSIS — I10 Essential (primary) hypertension: Secondary | ICD-10-CM | POA: Diagnosis not present

## 2020-02-09 DIAGNOSIS — Z6841 Body Mass Index (BMI) 40.0 and over, adult: Secondary | ICD-10-CM | POA: Diagnosis not present

## 2020-02-09 DIAGNOSIS — R809 Proteinuria, unspecified: Secondary | ICD-10-CM | POA: Diagnosis not present

## 2020-02-09 DIAGNOSIS — N1831 Chronic kidney disease, stage 3a: Secondary | ICD-10-CM | POA: Diagnosis not present

## 2020-02-09 DIAGNOSIS — I10 Essential (primary) hypertension: Secondary | ICD-10-CM | POA: Diagnosis not present

## 2020-02-09 DIAGNOSIS — E559 Vitamin D deficiency, unspecified: Secondary | ICD-10-CM | POA: Diagnosis not present

## 2020-03-02 ENCOUNTER — Other Ambulatory Visit: Payer: Self-pay | Admitting: Family Medicine

## 2020-03-09 ENCOUNTER — Other Ambulatory Visit: Payer: Self-pay | Admitting: Family Medicine

## 2020-03-31 ENCOUNTER — Other Ambulatory Visit: Payer: Self-pay | Admitting: Family Medicine

## 2020-03-31 DIAGNOSIS — G4700 Insomnia, unspecified: Secondary | ICD-10-CM

## 2020-03-31 DIAGNOSIS — F339 Major depressive disorder, recurrent, unspecified: Secondary | ICD-10-CM

## 2020-04-28 ENCOUNTER — Other Ambulatory Visit: Payer: Self-pay | Admitting: Family Medicine

## 2020-05-02 ENCOUNTER — Other Ambulatory Visit: Payer: Self-pay | Admitting: Family Medicine

## 2020-05-02 NOTE — Telephone Encounter (Signed)
Dettinger. NTBS 30 days given 04/04/20 °

## 2020-05-03 DIAGNOSIS — Z23 Encounter for immunization: Secondary | ICD-10-CM | POA: Diagnosis not present

## 2020-05-08 DIAGNOSIS — R809 Proteinuria, unspecified: Secondary | ICD-10-CM | POA: Diagnosis not present

## 2020-05-08 DIAGNOSIS — Z6841 Body Mass Index (BMI) 40.0 and over, adult: Secondary | ICD-10-CM | POA: Diagnosis not present

## 2020-05-08 DIAGNOSIS — E559 Vitamin D deficiency, unspecified: Secondary | ICD-10-CM | POA: Diagnosis not present

## 2020-05-08 DIAGNOSIS — N1831 Chronic kidney disease, stage 3a: Secondary | ICD-10-CM | POA: Diagnosis not present

## 2020-05-08 DIAGNOSIS — I10 Essential (primary) hypertension: Secondary | ICD-10-CM | POA: Diagnosis not present

## 2020-05-17 DIAGNOSIS — N1831 Chronic kidney disease, stage 3a: Secondary | ICD-10-CM | POA: Diagnosis not present

## 2020-05-17 DIAGNOSIS — I10 Essential (primary) hypertension: Secondary | ICD-10-CM | POA: Diagnosis not present

## 2020-05-17 DIAGNOSIS — E559 Vitamin D deficiency, unspecified: Secondary | ICD-10-CM | POA: Diagnosis not present

## 2020-05-17 DIAGNOSIS — Z6841 Body Mass Index (BMI) 40.0 and over, adult: Secondary | ICD-10-CM | POA: Diagnosis not present

## 2020-05-29 ENCOUNTER — Other Ambulatory Visit: Payer: Self-pay | Admitting: Family Medicine

## 2020-05-29 DIAGNOSIS — F339 Major depressive disorder, recurrent, unspecified: Secondary | ICD-10-CM

## 2020-05-29 DIAGNOSIS — G4700 Insomnia, unspecified: Secondary | ICD-10-CM

## 2020-05-30 NOTE — Telephone Encounter (Signed)
Appointment scheduled.

## 2020-05-30 NOTE — Telephone Encounter (Signed)
Dettinger. NTBS 30 days given 04/04/20

## 2020-06-09 ENCOUNTER — Ambulatory Visit (INDEPENDENT_AMBULATORY_CARE_PROVIDER_SITE_OTHER): Payer: Medicare Other | Admitting: Family Medicine

## 2020-06-09 ENCOUNTER — Other Ambulatory Visit: Payer: Self-pay

## 2020-06-09 ENCOUNTER — Encounter: Payer: Self-pay | Admitting: Family Medicine

## 2020-06-09 VITALS — BP 139/74 | HR 57 | Temp 97.7°F | Ht 72.0 in | Wt 314.0 lb

## 2020-06-09 DIAGNOSIS — Z125 Encounter for screening for malignant neoplasm of prostate: Secondary | ICD-10-CM

## 2020-06-09 DIAGNOSIS — G4700 Insomnia, unspecified: Secondary | ICD-10-CM

## 2020-06-09 DIAGNOSIS — K219 Gastro-esophageal reflux disease without esophagitis: Secondary | ICD-10-CM | POA: Diagnosis not present

## 2020-06-09 DIAGNOSIS — F339 Major depressive disorder, recurrent, unspecified: Secondary | ICD-10-CM

## 2020-06-09 DIAGNOSIS — E782 Mixed hyperlipidemia: Secondary | ICD-10-CM

## 2020-06-09 DIAGNOSIS — I10 Essential (primary) hypertension: Secondary | ICD-10-CM

## 2020-06-09 DIAGNOSIS — J4520 Mild intermittent asthma, uncomplicated: Secondary | ICD-10-CM | POA: Diagnosis not present

## 2020-06-09 MED ORDER — AMLODIPINE BESYLATE 10 MG PO TABS
10.0000 mg | ORAL_TABLET | Freq: Every day | ORAL | 3 refills | Status: DC
Start: 2020-06-09 — End: 2021-06-12

## 2020-06-09 MED ORDER — ALBUTEROL SULFATE HFA 108 (90 BASE) MCG/ACT IN AERS: INHALATION_SPRAY | RESPIRATORY_TRACT | 11 refills | Status: DC

## 2020-06-09 MED ORDER — MIRTAZAPINE 15 MG PO TABS: 15.0000 mg | ORAL_TABLET | Freq: Every day | ORAL | 3 refills | Status: DC

## 2020-06-09 MED ORDER — LISINOPRIL-HYDROCHLOROTHIAZIDE 20-25 MG PO TABS
1.0000 | ORAL_TABLET | Freq: Every day | ORAL | 3 refills | Status: DC
Start: 2020-06-09 — End: 2021-06-12

## 2020-06-09 MED ORDER — PRAVASTATIN SODIUM 80 MG PO TABS
80.0000 mg | ORAL_TABLET | Freq: Every day | ORAL | 3 refills | Status: DC
Start: 2020-06-09 — End: 2021-06-12

## 2020-06-09 MED ORDER — PROPRANOLOL HCL 10 MG PO TABS
10.0000 mg | ORAL_TABLET | Freq: Two times a day (BID) | ORAL | 3 refills | Status: DC
Start: 2020-06-09 — End: 2021-06-12

## 2020-06-09 MED ORDER — ESCITALOPRAM OXALATE 20 MG PO TABS: 20.0000 mg | ORAL_TABLET | Freq: Every day | ORAL | 3 refills | Status: DC

## 2020-06-09 MED ORDER — BUDESONIDE-FORMOTEROL FUMARATE 160-4.5 MCG/ACT IN AERO: 2.0000 | INHALATION_SPRAY | Freq: Two times a day (BID) | RESPIRATORY_TRACT | 12 refills | Status: DC

## 2020-06-09 NOTE — Progress Notes (Signed)
BP 139/74   Pulse (!) 57   Temp 97.7 F (36.5 C)   Ht 6' (1.829 m)   Wt (!) 314 lb (142.4 kg)   SpO2 97%   BMI 42.59 kg/m    Subjective:   Patient ID: Martin Schultz, male    DOB: Feb 08, 1951, 69 y.o.   MRN: [id number]  HPI: Martin Schultz is a 69 y.o. male presenting on 06/09/2020 for Medical Management of Chronic Issues and Hypertension   HPI Hypertension Patient is currently on lisinopril and propranolol and amlodipine, and their blood pressure today is 139/74. Patient denies any lightheadedness or dizziness. Patient denies headaches, blurred vision, chest pains, shortness of breath, or weakness. Denies any side effects from medication and is content with current medication.   Hyperlipidemia Patient is coming in for recheck of his hyperlipidemia. The patient is currently taking pravastatin. They deny any issues with myalgias or history of liver damage from it. They deny any focal numbness or weakness or chest pain.   GERD Patient is currently on famotidine.  She denies any major symptoms or abdominal pain or belching or burping. She denies any blood in her stool or lightheadedness or dizziness.   asthma Patient is coming in for asthma recheck today.  He is currently on albuterol.  He has a mild chronic cough but denies any major coughing spells or wheezing spells.  He has 0nighttime symptoms per week and 5daytime symptoms per week currently.   Relevant past medical, surgical, family and social history reviewed and updated as indicated. Interim medical history since our last visit reviewed. Allergies and medications reviewed and updated.  Review of Systems  Constitutional: Negative for chills and fever.  Eyes: Negative for visual disturbance.  Respiratory: Negative for shortness of breath and wheezing.   Cardiovascular: Negative for chest pain and leg swelling.  Musculoskeletal: Negative for back pain and gait problem.  Skin: Negative for rash.  Neurological: Negative for  dizziness, weakness and light-headedness.  All other systems reviewed and are negative.   Per HPI unless specifically indicated above   Allergies as of 06/09/2020      Reactions   Trazodone And Nefazodone Other (See Comments)   Hallucination   Benadryl [diphenhydramine] Other (See Comments)   Shakes, tremor      Medication List       Accurate as of June 09, 2020  9:07 AM. If you have any questions, ask your nurse or doctor.        STOP taking these medications   predniSONE 20 MG tablet Commonly known as: DELTASONE Stopped by: Fransisca Kaufmann Dicky Boer, MD     TAKE these medications   albuterol 108 (90 Base) MCG/ACT inhaler Commonly known as: Ventolin HFA INHALE 2 PUFFS 4 TIMES DAILY.   amLODipine 10 MG tablet Commonly known as: NORVASC Take 1 tablet (10 mg total) by mouth daily. (Needs to be seen before next refill)   aspirin EC 81 MG tablet Take 81 mg by mouth daily.   cetirizine 10 MG tablet Commonly known as: ZYRTEC TAKE (1) TABLET BY MOUTH ONCE DAILY.   cholecalciferol 1000 units tablet Commonly known as: VITAMIN D Take 1,000 Units by mouth daily.   escitalopram 20 MG tablet Commonly known as: LEXAPRO Take 1 tablet (20 mg total) by mouth at bedtime.   famotidine 20 MG tablet Commonly known as: PEPCID TAKE 1 TABLET BY MOUTH TWICE DAILY.   fluticasone 50 MCG/ACT nasal spray Commonly known as: FLONASE INSTILL 1 SPRAY EACH NOSTRIL  ONCE DAILY.   lisinopril-hydrochlorothiazide 20-25 MG tablet Commonly known as: ZESTORETIC Take 1 tablet by mouth daily.   mirtazapine 15 MG tablet Commonly known as: REMERON Take 1 tablet (15 mg total) by mouth at bedtime. (Needs to be seen before next refill)   olopatadine 0.1 % ophthalmic solution Commonly known as: PATANOL INSTILL 1 DROP INTO AFFECTED EYE (S) TWICE DAILY.   pravastatin 80 MG tablet Commonly known as: PRAVACHOL Take 1 tablet (80 mg total) by mouth at bedtime. (Needs to be seen before next refill)     propranolol 10 MG tablet Commonly known as: INDERAL Take 1 tablet (10 mg total) by mouth 2 (two) times daily. To help headache and blood pressure (Needs to be seen before next refill)        Objective:   BP 139/74   Pulse (!) 57   Temp 97.7 F (36.5 C)   Ht 6' (1.829 m)   Wt (!) 314 lb (142.4 kg)   SpO2 97%   BMI 42.59 kg/m   Wt Readings from Last 3 Encounters:  06/09/20 (!) 314 lb (142.4 kg)  09/16/18 277 lb 9.6 oz (125.9 kg)  08/13/18 278 lb (126.1 kg)    Physical Exam Vitals and nursing note reviewed.  Constitutional:      General: He is not in acute distress.    Appearance: He is well-developed. He is not diaphoretic.  Eyes:     General: No scleral icterus.    Conjunctiva/sclera: Conjunctivae normal.  Neck:     Thyroid: No thyromegaly.  Cardiovascular:     Rate and Rhythm: Normal rate and regular rhythm.     Heart sounds: Normal heart sounds. No murmur heard.   Pulmonary:     Effort: Pulmonary effort is normal. No respiratory distress.     Breath sounds: Normal breath sounds. No wheezing or rhonchi.  Musculoskeletal:        General: Normal range of motion.     Cervical back: Neck supple.  Lymphadenopathy:     Cervical: No cervical adenopathy.  Skin:    General: Skin is warm and dry.     Findings: No rash.  Neurological:     Mental Status: He is alert and oriented to person, place, and time.     Coordination: Coordination normal.  Psychiatric:        Behavior: Behavior normal.     Results for orders placed or performed in visit on 11/04/18  Cologuard  Result Value Ref Range   Cologuard Negative Negative    Assessment & Plan:   Problem List Items Addressed This Visit      Cardiovascular and Mediastinum   Essential hypertension   Relevant Medications   amLODipine (NORVASC) 10 MG tablet   lisinopril-hydrochlorothiazide (ZESTORETIC) 20-25 MG tablet   pravastatin (PRAVACHOL) 80 MG tablet   propranolol (INDERAL) 10 MG tablet   Other Relevant  Orders   CMP14+EGFR     Respiratory   Asthma in adult, mild intermittent, uncomplicated   Relevant Medications   albuterol (VENTOLIN HFA) 108 (90 Base) MCG/ACT inhaler   budesonide-formoterol (SYMBICORT) 160-4.5 MCG/ACT inhaler     Digestive   GERD without esophagitis   Relevant Orders   CBC with Differential/Platelet     Other   HLD (hyperlipidemia) - Primary   Relevant Medications   amLODipine (NORVASC) 10 MG tablet   lisinopril-hydrochlorothiazide (ZESTORETIC) 20-25 MG tablet   pravastatin (PRAVACHOL) 80 MG tablet   propranolol (INDERAL) 10 MG tablet   Other Relevant Orders  Lipid panel   Depression, recurrent (HCC)   Relevant Medications   escitalopram (LEXAPRO) 20 MG tablet   mirtazapine (REMERON) 15 MG tablet   Insomnia   Relevant Medications   escitalopram (LEXAPRO) 20 MG tablet   mirtazapine (REMERON) 15 MG tablet    Other Visit Diagnoses    Prostate cancer screening       Relevant Orders   PSA, total and free      Patient is still having some breathing issues that are breaking through on his asthma, will try and add Symbicort as a maintenance inhaler, still use the albuterol rescue inhaler as needed.   Follow up plan: Return if symptoms worsen or fail to improve.  Counseling provided for all of the vaccine components No orders of the defined types were placed in this encounter.   Caryl Pina, MD [facility] 06/09/2020, 9:07 AM

## 2020-08-08 DIAGNOSIS — Z6841 Body Mass Index (BMI) 40.0 and over, adult: Secondary | ICD-10-CM | POA: Diagnosis not present

## 2020-08-08 DIAGNOSIS — N1831 Chronic kidney disease, stage 3a: Secondary | ICD-10-CM | POA: Diagnosis not present

## 2020-08-08 DIAGNOSIS — I10 Essential (primary) hypertension: Secondary | ICD-10-CM | POA: Diagnosis not present

## 2020-08-08 DIAGNOSIS — E559 Vitamin D deficiency, unspecified: Secondary | ICD-10-CM | POA: Diagnosis not present

## 2020-08-16 DIAGNOSIS — E559 Vitamin D deficiency, unspecified: Secondary | ICD-10-CM | POA: Diagnosis not present

## 2020-08-16 DIAGNOSIS — R809 Proteinuria, unspecified: Secondary | ICD-10-CM | POA: Diagnosis not present

## 2020-08-16 DIAGNOSIS — I10 Essential (primary) hypertension: Secondary | ICD-10-CM | POA: Diagnosis not present

## 2020-08-16 DIAGNOSIS — N1831 Chronic kidney disease, stage 3a: Secondary | ICD-10-CM | POA: Diagnosis not present

## 2020-08-30 ENCOUNTER — Other Ambulatory Visit: Payer: Self-pay | Admitting: Family Medicine

## 2020-09-25 ENCOUNTER — Other Ambulatory Visit: Payer: Self-pay | Admitting: Family Medicine

## 2020-10-19 ENCOUNTER — Other Ambulatory Visit: Payer: Self-pay | Admitting: Family Medicine

## 2020-11-09 DIAGNOSIS — R809 Proteinuria, unspecified: Secondary | ICD-10-CM | POA: Diagnosis not present

## 2020-11-09 DIAGNOSIS — N1831 Chronic kidney disease, stage 3a: Secondary | ICD-10-CM | POA: Diagnosis not present

## 2020-11-09 DIAGNOSIS — E559 Vitamin D deficiency, unspecified: Secondary | ICD-10-CM | POA: Diagnosis not present

## 2020-11-09 DIAGNOSIS — I10 Essential (primary) hypertension: Secondary | ICD-10-CM | POA: Diagnosis not present

## 2020-11-13 ENCOUNTER — Other Ambulatory Visit: Payer: Self-pay | Admitting: Family Medicine

## 2020-11-17 DIAGNOSIS — E559 Vitamin D deficiency, unspecified: Secondary | ICD-10-CM | POA: Diagnosis not present

## 2020-11-17 DIAGNOSIS — R809 Proteinuria, unspecified: Secondary | ICD-10-CM | POA: Diagnosis not present

## 2020-11-17 DIAGNOSIS — N1831 Chronic kidney disease, stage 3a: Secondary | ICD-10-CM | POA: Diagnosis not present

## 2020-11-17 DIAGNOSIS — I10 Essential (primary) hypertension: Secondary | ICD-10-CM | POA: Diagnosis not present

## 2020-12-08 ENCOUNTER — Other Ambulatory Visit: Payer: Self-pay

## 2020-12-08 ENCOUNTER — Ambulatory Visit (INDEPENDENT_AMBULATORY_CARE_PROVIDER_SITE_OTHER): Payer: Medicare Other | Admitting: Family Medicine

## 2020-12-08 ENCOUNTER — Encounter: Payer: Self-pay | Admitting: Family Medicine

## 2020-12-08 VITALS — BP 133/73 | HR 54 | Ht 72.0 in | Wt 313.0 lb

## 2020-12-08 DIAGNOSIS — I10 Essential (primary) hypertension: Secondary | ICD-10-CM | POA: Diagnosis not present

## 2020-12-08 DIAGNOSIS — J4520 Mild intermittent asthma, uncomplicated: Secondary | ICD-10-CM | POA: Diagnosis not present

## 2020-12-08 DIAGNOSIS — F339 Major depressive disorder, recurrent, unspecified: Secondary | ICD-10-CM

## 2020-12-08 DIAGNOSIS — Z23 Encounter for immunization: Secondary | ICD-10-CM | POA: Diagnosis not present

## 2020-12-08 DIAGNOSIS — K219 Gastro-esophageal reflux disease without esophagitis: Secondary | ICD-10-CM | POA: Diagnosis not present

## 2020-12-08 DIAGNOSIS — E782 Mixed hyperlipidemia: Secondary | ICD-10-CM

## 2020-12-08 MED ORDER — OLOPATADINE HCL 0.1 % OP SOLN
OPHTHALMIC | 5 refills | Status: DC
Start: 2020-12-08 — End: 2021-06-05

## 2020-12-08 MED ORDER — GUAIFENESIN ER 600 MG PO TB12: 600.0000 mg | ORAL_TABLET | Freq: Two times a day (BID) | ORAL | 1 refills | Status: DC

## 2020-12-08 NOTE — Progress Notes (Signed)
BP 133/73   Pulse (!) 54   Ht 6' (1.829 m)   Wt (!) 313 lb (142 kg)   SpO2 98%   BMI 42.45 kg/m    Subjective:   Patient ID: Martin Schultz, male    DOB: [birth year], 70 y.o.   MRN: [id number]  HPI: Martin Schultz is a 70 y.o. male presenting on 12/08/2020 for Medical Management of Chronic Issues, Hyperlipidemia, and Hypertension   HPI Hypertension Patient is currently on amlodipine and lisinopril hydrochlorothiazide and propranolol, and their blood pressure today is 133/73. Patient denies any lightheadedness or dizziness. Patient denies headaches, blurred vision, chest pains, shortness of breath, or weakness. Denies any side effects from medication and is content with current medication.   Hyperlipidemia Patient is coming in for recheck of his hyperlipidemia. The patient is currently taking pravastatin. They deny any issues with myalgias or history of liver damage from it. They deny any focal numbness or weakness or chest pain.   GERD Patient is currently on famotidine.  She denies any major symptoms or abdominal pain or belching or burping. She denies any blood in her stool or lightheadedness or dizziness.   Depression recheck Patient is coming in for depression recheck.  He is currently on Lexapro and Remeron.  Patient says he gets social anxiety and has trouble going out, currently takes Lexapro and the Remeron and they do help but that still has been a big issue especially now with Covid going on.  He gets services through social services because of that.  He also has some mental handicap because of the severe anxiety and that is why he gets services.  Asthma recheck Patient is currently coming in for asthma recheck, currently on Symbicort and albuterol.  He also uses Flonase for allergies.  Symbicort albuterol.  He is having little more congestion recently with the weather changes.  Relevant past medical, surgical, family and social history reviewed and updated as indicated.  Interim medical history since our last visit reviewed. Allergies and medications reviewed and updated.  Review of Systems  Constitutional: Negative for chills and fever.  HENT: Positive for congestion, postnasal drip and rhinorrhea. Negative for ear discharge, ear pain, sinus pressure, sneezing, sore throat and voice change.   Eyes: Negative for pain, discharge, redness and visual disturbance.  Respiratory: Positive for cough. Negative for shortness of breath and wheezing.   Cardiovascular: Negative for chest pain and leg swelling.  Musculoskeletal: Negative for gait problem.  Skin: Negative for rash.  All other systems reviewed and are negative.   Per HPI unless specifically indicated above   Allergies as of 12/08/2020      Reactions   Trazodone And Nefazodone Other (See Comments)   Hallucination   Benadryl [diphenhydramine] Other (See Comments)   Shakes, tremor      Medication List       Accurate as of December 08, 2020  9:35 AM. If you have any questions, ask your nurse or doctor.        albuterol 108 (90 Base) MCG/ACT inhaler Commonly known as: Ventolin HFA INHALE 2 PUFFS 4 TIMES DAILY.   amLODipine 10 MG tablet Commonly known as: NORVASC Take 1 tablet (10 mg total) by mouth daily.   aspirin EC 81 MG tablet Take 81 mg by mouth daily.   budesonide-formoterol 160-4.5 MCG/ACT inhaler Commonly known as: Symbicort Inhale 2 puffs into the lungs in the morning and at bedtime.   cetirizine 10 MG tablet Commonly known as: ZYRTEC TAKE (  1) TABLET BY MOUTH ONCE DAILY.   cholecalciferol 1000 units tablet Commonly known as: VITAMIN D Take 1,000 Units by mouth daily.   escitalopram 20 MG tablet Commonly known as: LEXAPRO Take 1 tablet (20 mg total) by mouth at bedtime.   famotidine 20 MG tablet Commonly known as: PEPCID TAKE 1 TABLET BY MOUTH TWICE DAILY.   fluticasone 50 MCG/ACT nasal spray Commonly known as: FLONASE INSTILL 1 SPRAY EACH NOSTRIL ONCE DAILY.    lisinopril-hydrochlorothiazide 20-25 MG tablet Commonly known as: ZESTORETIC Take 1 tablet by mouth daily.   mirtazapine 15 MG tablet Commonly known as: REMERON Take 1 tablet (15 mg total) by mouth at bedtime.   olopatadine 0.1 % ophthalmic solution Commonly known as: PATANOL INSTILL 1 DROP INTO AFFECTED EYE (S) TWICE DAILY. NEEDS OFFICE VISIT FOR REFILLS   pravastatin 80 MG tablet Commonly known as: PRAVACHOL Take 1 tablet (80 mg total) by mouth at bedtime.   propranolol 10 MG tablet Commonly known as: INDERAL Take 1 tablet (10 mg total) by mouth 2 (two) times daily. To help headache and blood pressure        Objective:   BP 133/73   Pulse (!) 54   Ht 6' (1.829 m)   Wt (!) 313 lb (142 kg)   SpO2 98%   BMI 42.45 kg/m   Wt Readings from Last 3 Encounters:  12/08/20 (!) 313 lb (142 kg)  06/09/20 (!) 314 lb (142.4 kg)  09/16/18 277 lb 9.6 oz (125.9 kg)    Physical Exam Vitals and nursing note reviewed.  Constitutional:      General: He is not in acute distress.    Appearance: He is well-developed. He is not diaphoretic.  Eyes:     General: No scleral icterus.    Conjunctiva/sclera: Conjunctivae normal.  Neck:     Thyroid: No thyromegaly.  Cardiovascular:     Rate and Rhythm: Normal rate and regular rhythm.     Heart sounds: Normal heart sounds. No murmur heard.   Pulmonary:     Effort: Pulmonary effort is normal. No respiratory distress.     Breath sounds: Normal breath sounds. No wheezing.  Musculoskeletal:        General: Normal range of motion.     Cervical back: Neck supple.  Lymphadenopathy:     Cervical: No cervical adenopathy.  Skin:    General: Skin is warm and dry.     Findings: No rash.  Neurological:     Mental Status: He is alert and oriented to person, place, and time.     Coordination: Coordination normal.  Psychiatric:        Behavior: Behavior normal.     Results for orders placed or performed in visit on 11/04/18  Cologuard   Result Value Ref Range   Cologuard Negative Negative    Assessment & Plan:   Problem List Items Addressed This Visit      Cardiovascular and Mediastinum   Essential hypertension   Relevant Orders   CMP14+EGFR     Respiratory   Asthma in adult, mild intermittent, uncomplicated     Digestive   GERD without esophagitis   Relevant Orders   CBC with Differential/Platelet     Other   HLD (hyperlipidemia) - Primary   Relevant Orders   CBC with Differential/Platelet   CMP14+EGFR   Lipid panel   Depression, recurrent ([place])   Relevant Orders   CBC with Differential/Platelet      Patient seems to be doing  well with everything sometimes a little congestion with his asthma, recommended Mucinex added to his current regimen that should help.  If worsens give [place] call Follow up plan: Return in about 6 months (around 06/10/2021), or if symptoms worsen or fail to improve, for  hypertension and cholesterol.  Counseling provided for all of the vaccine components No orders of the defined types were placed in this encounter.   Caryl Pina, MD [facility] 12/08/2020, 9:35 AM

## 2020-12-09 LAB — CBC WITH DIFFERENTIAL/PLATELET
Basophils Absolute: 0.1 10*3/uL (ref 0.0–0.2)
Basos: 1 %
EOS (ABSOLUTE): 0.4 10*3/uL (ref 0.0–0.4)
Eos: 6 %
Hematocrit: 50.3 % (ref 37.5–51.0)
Hemoglobin: 16.7 g/dL (ref 13.0–17.7)
Immature Grans (Abs): 0 10*3/uL (ref 0.0–0.1)
Immature Granulocytes: 0 %
Lymphocytes Absolute: 2.2 10*3/uL (ref 0.7–3.1)
Lymphs: 29 %
MCH: 28.9 pg (ref 26.6–33.0)
MCHC: 33.2 g/dL (ref 31.5–35.7)
MCV: 87 fL (ref 79–97)
Monocytes Absolute: 0.8 10*3/uL (ref 0.1–0.9)
Monocytes: 10 %
Neutrophils Absolute: 4.1 10*3/uL (ref 1.4–7.0)
Neutrophils: 54 %
Platelets: 329 10*3/uL (ref 150–450)
RBC: 5.77 x10E6/uL (ref 4.14–5.80)
RDW: 12.4 % (ref 11.6–15.4)
WBC: 7.6 10*3/uL (ref 3.4–10.8)

## 2020-12-09 LAB — CMP14+EGFR
ALT: 27 IU/L (ref 0–44)
AST: 20 IU/L (ref 0–40)
Albumin/Globulin Ratio: 1.5 (ref 1.2–2.2)
Albumin: 4.6 g/dL (ref 3.7–4.7)
Alkaline Phosphatase: 65 IU/L (ref 44–121)
BUN/Creatinine Ratio: 9 — ABNORMAL LOW (ref 10–24)
BUN: 15 mg/dL (ref 8–27)
Bilirubin Total: 0.5 mg/dL (ref 0.0–1.2)
CO2: 25 mmol/L (ref 20–29)
Calcium: 9.6 mg/dL (ref 8.6–10.2)
Chloride: 97 mmol/L (ref 96–106)
Creatinine, Ser: 1.59 mg/dL — ABNORMAL HIGH (ref 0.76–1.27)
Globulin, Total: 3 g/dL (ref 1.5–4.5)
Glucose: 91 mg/dL (ref 65–99)
Potassium: 4.8 mmol/L (ref 3.5–5.2)
Sodium: 138 mmol/L (ref 134–144)
Total Protein: 7.6 g/dL (ref 6.0–8.5)
eGFR: 46 mL/min/{1.73_m2} — ABNORMAL LOW (ref >59)

## 2020-12-09 LAB — LIPID PANEL
Chol/HDL Ratio: 3.7 ratio (ref 0.0–5.0)
Cholesterol, Total: 179 mg/dL (ref 100–199)
HDL: 49 mg/dL (ref >39)
LDL Chol Calc (NIH): 97 mg/dL (ref 0–99)
Triglycerides: 192 mg/dL — ABNORMAL HIGH (ref 0–149)
VLDL Cholesterol Cal: 33 mg/dL (ref 5–40)

## 2020-12-28 DIAGNOSIS — L718 Other rosacea: Secondary | ICD-10-CM | POA: Diagnosis not present

## 2020-12-28 DIAGNOSIS — L570 Actinic keratosis: Secondary | ICD-10-CM | POA: Diagnosis not present

## 2020-12-28 DIAGNOSIS — X32XXXD Exposure to sunlight, subsequent encounter: Secondary | ICD-10-CM | POA: Diagnosis not present

## 2020-12-28 DIAGNOSIS — L255 Unspecified contact dermatitis due to plants, except food: Secondary | ICD-10-CM | POA: Diagnosis not present

## 2020-12-28 DIAGNOSIS — B078 Other viral warts: Secondary | ICD-10-CM | POA: Diagnosis not present

## 2021-02-07 DIAGNOSIS — I10 Essential (primary) hypertension: Secondary | ICD-10-CM | POA: Diagnosis not present

## 2021-02-07 DIAGNOSIS — R809 Proteinuria, unspecified: Secondary | ICD-10-CM | POA: Diagnosis not present

## 2021-02-07 DIAGNOSIS — E559 Vitamin D deficiency, unspecified: Secondary | ICD-10-CM | POA: Diagnosis not present

## 2021-02-07 DIAGNOSIS — N1831 Chronic kidney disease, stage 3a: Secondary | ICD-10-CM | POA: Diagnosis not present

## 2021-02-08 ENCOUNTER — Other Ambulatory Visit: Payer: Self-pay | Admitting: Family Medicine

## 2021-02-14 DIAGNOSIS — R809 Proteinuria, unspecified: Secondary | ICD-10-CM | POA: Diagnosis not present

## 2021-02-14 DIAGNOSIS — I10 Essential (primary) hypertension: Secondary | ICD-10-CM | POA: Diagnosis not present

## 2021-02-14 DIAGNOSIS — E559 Vitamin D deficiency, unspecified: Secondary | ICD-10-CM | POA: Diagnosis not present

## 2021-02-14 DIAGNOSIS — N1831 Chronic kidney disease, stage 3a: Secondary | ICD-10-CM | POA: Diagnosis not present

## 2021-04-19 DIAGNOSIS — L718 Other rosacea: Secondary | ICD-10-CM | POA: Diagnosis not present

## 2021-04-19 DIAGNOSIS — L550 Sunburn of first degree: Secondary | ICD-10-CM | POA: Diagnosis not present

## 2021-05-07 ENCOUNTER — Other Ambulatory Visit: Payer: Self-pay | Admitting: Family Medicine

## 2021-05-08 DIAGNOSIS — N1831 Chronic kidney disease, stage 3a: Secondary | ICD-10-CM | POA: Diagnosis not present

## 2021-05-08 DIAGNOSIS — R809 Proteinuria, unspecified: Secondary | ICD-10-CM | POA: Diagnosis not present

## 2021-05-08 DIAGNOSIS — I10 Essential (primary) hypertension: Secondary | ICD-10-CM | POA: Diagnosis not present

## 2021-05-08 DIAGNOSIS — E559 Vitamin D deficiency, unspecified: Secondary | ICD-10-CM | POA: Diagnosis not present

## 2021-05-16 DIAGNOSIS — I10 Essential (primary) hypertension: Secondary | ICD-10-CM | POA: Diagnosis not present

## 2021-05-16 DIAGNOSIS — N1831 Chronic kidney disease, stage 3a: Secondary | ICD-10-CM | POA: Diagnosis not present

## 2021-05-16 DIAGNOSIS — R809 Proteinuria, unspecified: Secondary | ICD-10-CM | POA: Diagnosis not present

## 2021-06-01 ENCOUNTER — Other Ambulatory Visit: Payer: Self-pay | Admitting: Family Medicine

## 2021-06-12 ENCOUNTER — Ambulatory Visit (INDEPENDENT_AMBULATORY_CARE_PROVIDER_SITE_OTHER): Payer: Medicare Other | Admitting: Family Medicine

## 2021-06-12 ENCOUNTER — Other Ambulatory Visit: Payer: Self-pay

## 2021-06-12 ENCOUNTER — Encounter: Payer: Self-pay | Admitting: Family Medicine

## 2021-06-12 VITALS — BP 159/79 | HR 48 | Ht 72.0 in | Wt 313.0 lb

## 2021-06-12 DIAGNOSIS — E782 Mixed hyperlipidemia: Secondary | ICD-10-CM | POA: Diagnosis not present

## 2021-06-12 DIAGNOSIS — Z23 Encounter for immunization: Secondary | ICD-10-CM | POA: Diagnosis not present

## 2021-06-12 DIAGNOSIS — G4700 Insomnia, unspecified: Secondary | ICD-10-CM | POA: Diagnosis not present

## 2021-06-12 DIAGNOSIS — J4520 Mild intermittent asthma, uncomplicated: Secondary | ICD-10-CM | POA: Diagnosis not present

## 2021-06-12 DIAGNOSIS — F339 Major depressive disorder, recurrent, unspecified: Secondary | ICD-10-CM

## 2021-06-12 DIAGNOSIS — I10 Essential (primary) hypertension: Secondary | ICD-10-CM | POA: Diagnosis not present

## 2021-06-12 MED ORDER — BUDESONIDE-FORMOTEROL FUMARATE 160-4.5 MCG/ACT IN AERO: 2.0000 | INHALATION_SPRAY | Freq: Two times a day (BID) | RESPIRATORY_TRACT | 12 refills | Status: DC

## 2021-06-12 MED ORDER — ESCITALOPRAM OXALATE 20 MG PO TABS: 20.0000 mg | ORAL_TABLET | Freq: Every day | ORAL | 3 refills | Status: DC

## 2021-06-12 MED ORDER — ALBUTEROL SULFATE HFA 108 (90 BASE) MCG/ACT IN AERS: INHALATION_SPRAY | RESPIRATORY_TRACT | 11 refills | Status: DC

## 2021-06-12 MED ORDER — AMLODIPINE BESYLATE 10 MG PO TABS: 10.0000 mg | ORAL_TABLET | Freq: Every day | ORAL | 3 refills | Status: DC

## 2021-06-12 MED ORDER — PRAVASTATIN SODIUM 80 MG PO TABS: 80.0000 mg | ORAL_TABLET | Freq: Every day | ORAL | 3 refills | Status: DC

## 2021-06-12 MED ORDER — PROPRANOLOL HCL 10 MG PO TABS: 10.0000 mg | ORAL_TABLET | Freq: Two times a day (BID) | ORAL | 3 refills | Status: DC

## 2021-06-12 MED ORDER — FAMOTIDINE 20 MG PO TABS: 20.0000 mg | ORAL_TABLET | Freq: Two times a day (BID) | ORAL | 3 refills | Status: DC

## 2021-06-12 MED ORDER — LISINOPRIL-HYDROCHLOROTHIAZIDE 20-25 MG PO TABS: 1.0000 | ORAL_TABLET | Freq: Every day | ORAL | 3 refills | Status: DC

## 2021-06-12 MED ORDER — MIRTAZAPINE 15 MG PO TABS: 15.0000 mg | ORAL_TABLET | Freq: Every day | ORAL | 3 refills | Status: DC

## 2021-06-12 NOTE — Addendum Note (Signed)
Addended by: Alphonzo Dublin on: 06/12/2021 12:02 PM   Modules accepted: Orders

## 2021-06-12 NOTE — Progress Notes (Signed)
BP (!) 159/79   Pulse (!) 48   Ht 6' (1.829 m)   Wt (!) 313 lb (142 kg)   SpO2 97%   BMI 42.45 kg/m    Subjective:   Patient ID: Martin Schultz, male    DOB: [birth year], 70 y.o.   MRN: [id number]  HPI: Martin Schultz is a 70 y.o. male presenting on 06/12/2021 for Medical Management of Chronic Issues, Hyperlipidemia, and Hypertension   HPI Asthma Patient is coming in for asthma recheck today.  He is currently on Symbicort but he is running out of it because they have not had it in stock at the pharmacy..  He has a mild chronic cough but denies any major coughing spells or wheezing spells.  He has 0nighttime symptoms per week and 0daytime symptoms per week currently.   Hyperlipidemia Patient is coming in for recheck of his hyperlipidemia. The patient is currently taking pravastatin. They deny any issues with myalgias or history of liver damage from it. They deny any focal numbness or weakness or chest pain.   Hypertension Patient is currently on lisinopril hydrochlorothiazide and propranolol and amlodipine, and their blood pressure today is 153/84, will keep close eye on it at home.. Patient denies any lightheadedness or dizziness. Patient denies headaches, blurred vision, chest pains, shortness of breath, or weakness. Denies any side effects from medication and is content with current medication.   Relevant past medical, surgical, family and social history reviewed and updated as indicated. Interim medical history since our last visit reviewed. Allergies and medications reviewed and updated.  Review of Systems  Constitutional:  Negative for chills and fever.  Eyes:  Negative for discharge.  Respiratory:  Negative for shortness of breath and wheezing.   Cardiovascular:  Negative for chest pain and leg swelling.  Musculoskeletal:  Negative for back pain and gait problem.  Skin:  Negative for rash.  All other systems reviewed and are negative.  Per HPI unless specifically indicated  above   Allergies as of 06/12/2021       Reactions   Trazodone And Nefazodone Other (See Comments)   Hallucination   Doxycycline    Kidney concerns   Benadryl [diphenhydramine] Other (See Comments)   Shakes, tremor        Medication List        Accurate as of June 12, 2021 11:25 AM. If you have any questions, ask your nurse or doctor.          albuterol 108 (90 Base) MCG/ACT inhaler Commonly known as: Ventolin HFA INHALE 2 PUFFS 4 TIMES DAILY.   amLODipine 10 MG tablet Commonly known as: NORVASC Take 1 tablet (10 mg total) by mouth daily.   aspirin EC 81 MG tablet Take 81 mg by mouth daily.   budesonide-formoterol 160-4.5 MCG/ACT inhaler Commonly known as: Symbicort Inhale 2 puffs into the lungs in the morning and at bedtime.   cetirizine 10 MG tablet Commonly known as: ZYRTEC TAKE (1) TABLET BY MOUTH ONCE DAILY.   cholecalciferol 1000 units tablet Commonly known as: VITAMIN D Take 1,000 Units by mouth daily.   escitalopram 20 MG tablet Commonly known as: LEXAPRO Take 1 tablet (20 mg total) by mouth at bedtime.   famotidine 20 MG tablet Commonly known as: PEPCID TAKE 1 TABLET BY MOUTH TWICE DAILY.   fluticasone 50 MCG/ACT nasal spray Commonly known as: FLONASE INSTILL 1 SPRAY EACH NOSTRIL ONCE DAILY.   guaiFENesin 600 MG 12 hr tablet Commonly known as: Mucinex Take  1 tablet (600 mg total) by mouth 2 (two) times daily.   lisinopril-hydrochlorothiazide 20-25 MG tablet Commonly known as: ZESTORETIC Take 1 tablet by mouth daily.   mirtazapine 15 MG tablet Commonly known as: REMERON Take 1 tablet (15 mg total) by mouth at bedtime.   olopatadine 0.1 % ophthalmic solution Commonly known as: PATANOL INSTILL 1 DROP INTO AFFECTED EYE (S) TWICE DAILY.   pravastatin 80 MG tablet Commonly known as: PRAVACHOL Take 1 tablet (80 mg total) by mouth at bedtime.   propranolol 10 MG tablet Commonly known as: INDERAL Take 1 tablet (10 mg total) by  mouth 2 (two) times daily. To help headache and blood pressure         Objective:   BP (!) 159/79   Pulse (!) 48   Ht 6' (1.829 m)   Wt (!) 313 lb (142 kg)   SpO2 97%   BMI 42.45 kg/m   Wt Readings from Last 3 Encounters:  06/12/21 (!) 313 lb (142 kg)  12/08/20 (!) 313 lb (142 kg)  06/09/20 (!) 314 lb (142.4 kg)    Physical Exam    Assessment & Plan:   Problem List Items Addressed This Visit       Cardiovascular and Mediastinum   Essential hypertension - Primary   Relevant Medications   amLODipine (NORVASC) 10 MG tablet   lisinopril-hydrochlorothiazide (ZESTORETIC) 20-25 MG tablet   pravastatin (PRAVACHOL) 80 MG tablet   propranolol (INDERAL) 10 MG tablet   Other Relevant Orders   CBC with Differential/Platelet   CMP14+EGFR     Respiratory   Asthma in adult, mild intermittent, uncomplicated   Relevant Medications   albuterol (VENTOLIN HFA) 108 (90 Base) MCG/ACT inhaler   budesonide-formoterol (SYMBICORT) 160-4.5 MCG/ACT inhaler     Other   HLD (hyperlipidemia)   Relevant Medications   amLODipine (NORVASC) 10 MG tablet   lisinopril-hydrochlorothiazide (ZESTORETIC) 20-25 MG tablet   pravastatin (PRAVACHOL) 80 MG tablet   propranolol (INDERAL) 10 MG tablet   Other Relevant Orders   Lipid panel   Depression, recurrent ([facility])   Relevant Medications   escitalopram (LEXAPRO) 20 MG tablet   mirtazapine (REMERON) 15 MG tablet   Insomnia   Relevant Medications   escitalopram (LEXAPRO) 20 MG tablet   mirtazapine (REMERON) 15 MG tablet  Symbicort has been out of stock so I gave him a sample of breast 3 that he can use in the meantime until he gets the Symbicort back, will do lower dose 1 puff twice a day  Will check blood work today, blood pressure slightly elevated, will monitor closely.  No changes in medication today  Follow up plan: No follow-ups on file.  Counseling provided for all of the vaccine components No orders of the defined types were placed in  this encounter.   Caryl Pina, MD [facility] 06/12/2021, 11:25 AM

## 2021-06-13 LAB — CBC WITH DIFFERENTIAL/PLATELET
Basophils Absolute: 0.1 10*3/uL (ref 0.0–0.2)
Basos: 1 %
EOS (ABSOLUTE): 0.5 10*3/uL — ABNORMAL HIGH (ref 0.0–0.4)
Eos: 7 %
Hematocrit: 48.1 % (ref 37.5–51.0)
Hemoglobin: 15.9 g/dL (ref 13.0–17.7)
Immature Grans (Abs): 0 10*3/uL (ref 0.0–0.1)
Immature Granulocytes: 0 %
Lymphocytes Absolute: 2.5 10*3/uL (ref 0.7–3.1)
Lymphs: 35 %
MCH: 28.1 pg (ref 26.6–33.0)
MCHC: 33.1 g/dL (ref 31.5–35.7)
MCV: 85 fL (ref 79–97)
Monocytes Absolute: 0.7 10*3/uL (ref 0.1–0.9)
Monocytes: 10 %
Neutrophils Absolute: 3.3 10*3/uL (ref 1.4–7.0)
Neutrophils: 47 %
Platelets: 303 10*3/uL (ref 150–450)
RBC: 5.65 x10E6/uL (ref 4.14–5.80)
RDW: 12.6 % (ref 11.6–15.4)
WBC: 7.1 10*3/uL (ref 3.4–10.8)

## 2021-06-13 LAB — CMP14+EGFR
ALT: 22 IU/L (ref 0–44)
AST: 23 IU/L (ref 0–40)
Albumin/Globulin Ratio: 1.5 (ref 1.2–2.2)
Albumin: 4.6 g/dL (ref 3.7–4.7)
Alkaline Phosphatase: 67 IU/L (ref 44–121)
BUN/Creatinine Ratio: 9 — ABNORMAL LOW (ref 10–24)
BUN: 15 mg/dL (ref 8–27)
Bilirubin Total: 0.8 mg/dL (ref 0.0–1.2)
CO2: 26 mmol/L (ref 20–29)
Calcium: 9.5 mg/dL (ref 8.6–10.2)
Chloride: 96 mmol/L (ref 96–106)
Creatinine, Ser: 1.58 mg/dL — ABNORMAL HIGH (ref 0.76–1.27)
Globulin, Total: 3 g/dL (ref 1.5–4.5)
Glucose: 107 mg/dL — ABNORMAL HIGH (ref 65–99)
Potassium: 4.4 mmol/L (ref 3.5–5.2)
Sodium: 138 mmol/L (ref 134–144)
Total Protein: 7.6 g/dL (ref 6.0–8.5)
eGFR: 46 mL/min/1.73 — ABNORMAL LOW (ref >59)

## 2021-06-13 LAB — LIPID PANEL
Chol/HDL Ratio: 3.5 ratio (ref 0.0–5.0)
Cholesterol, Total: 157 mg/dL (ref 100–199)
HDL: 45 mg/dL (ref >39)
LDL Chol Calc (NIH): 83 mg/dL (ref 0–99)
Triglycerides: 172 mg/dL — ABNORMAL HIGH (ref 0–149)
VLDL Cholesterol Cal: 29 mg/dL (ref 5–40)

## 2021-07-02 ENCOUNTER — Other Ambulatory Visit: Payer: Self-pay | Admitting: Family Medicine

## 2021-08-01 DIAGNOSIS — I10 Essential (primary) hypertension: Secondary | ICD-10-CM | POA: Diagnosis not present

## 2021-08-01 DIAGNOSIS — R809 Proteinuria, unspecified: Secondary | ICD-10-CM | POA: Diagnosis not present

## 2021-08-01 DIAGNOSIS — N1831 Chronic kidney disease, stage 3a: Secondary | ICD-10-CM | POA: Diagnosis not present

## 2021-08-02 ENCOUNTER — Ambulatory Visit (INDEPENDENT_AMBULATORY_CARE_PROVIDER_SITE_OTHER): Payer: Medicare Other

## 2021-08-02 VITALS — Ht 72.0 in | Wt 313.0 lb

## 2021-08-02 DIAGNOSIS — Z0000 Encounter for general adult medical examination without abnormal findings: Secondary | ICD-10-CM | POA: Diagnosis not present
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
## 2021-08-02 NOTE — Patient Instructions (Signed)
Martin Schultz , Thank you for taking time to come for your Medicare Wellness Visit. I appreciate your ongoing commitment to your health goals. Please review the following plan we discussed and let me know if I can assist you in the future.   Screening recommendations/referrals: Colonoscopy: Cologuard done 09/07/2018 - Repeat every 3 years - patient declined Recommended yearly ophthalmology/optometry visit for glaucoma screening and checkup Recommended yearly dental visit for hygiene and checkup  Vaccinations: Influenza vaccine: Done 12/08/2020 - repeat in fall *due now Pneumococcal vaccine: Done 03/03/2015 & 03/04/2016 Tdap vaccine: Done 03/05/2018 - Repeat in 10 years  Shingles vaccine: Done 08/13/2018 & 06/12/2021   Covid-19: Martin Schultz done 05/03/2020 - due for boosters  Advanced directives: Advance directive discussed with you today. Even though you declined this today, please call our office should you change your mind, and we can give you the proper paperwork for you to fill out.   Conditions/risks identified: Aim for 30 minutes of exercise or brisk walking each day, drink 6-8 glasses of water and eat lots of fruits and vegetables.   Next appointment: Follow up in one year for your annual wellness visit.   Preventive Care 72 Years and Older, Male  Preventive care refers to lifestyle choices and visits with your health care provider that can promote health and wellness. What does preventive care include? A yearly physical exam. This is also called an annual well check. Dental exams once or twice a year. Routine eye exams. Ask your health care provider how often you should have your eyes checked. Personal lifestyle choices, including: Daily care of your teeth and gums. Regular physical activity. Eating a healthy diet. Avoiding tobacco and drug use. Limiting alcohol use. Practicing safe sex. Taking low doses of aspirin every day. Taking vitamin and mineral supplements as recommended by your  health care provider. What happens during an annual well check? The services and screenings done by your health care provider during your annual well check will depend on your age, Schultz health, lifestyle risk factors, and family history of disease. Counseling  Your health care provider may ask you questions about your: Alcohol use. Tobacco use. Drug use. Emotional well-being. Home and relationship well-being. Sexual activity. Eating habits. History of falls. Memory and ability to understand (cognition). Work and work Statistician. Screening  You may have the following tests or measurements: Height, weight, and BMI. Blood pressure. Lipid and cholesterol levels. These may be checked every 5 years, or more frequently if you are over 63 years old. Skin check. Lung cancer screening. You may have this screening every year starting at age 74 if you have a 30-pack-year history of smoking and currently smoke or have quit within the past 15 years. Fecal occult blood test (FOBT) of the stool. You may have this test every year starting at age 16. Flexible sigmoidoscopy or colonoscopy. You may have a sigmoidoscopy every 5 years or a colonoscopy every 10 years starting at age 21. Prostate cancer screening. Recommendations will vary depending on your family history and other risks. Hepatitis C blood test. Hepatitis B blood test. Sexually transmitted disease (STD) testing. Diabetes screening. This is done by checking your blood sugar (glucose) after you have not eaten for a while (fasting). You may have this done every 1-3 years. Abdominal aortic aneurysm (AAA) screening. You may need this if you are a current or former smoker. Osteoporosis. You may be screened starting at age 40 if you are at high risk. Talk with your health care provider  about your test results, treatment options, and if necessary, the need for more tests. Vaccines  Your health care provider may recommend certain vaccines, such  as: Influenza vaccine. This is recommended every year. Tetanus, diphtheria, and acellular pertussis (Tdap, Td) vaccine. You may need a Td booster every 10 years. Zoster vaccine. You may need this after age 3. Pneumococcal 13-valent conjugate (PCV13) vaccine. One dose is recommended after age 13. Pneumococcal polysaccharide (PPSV23) vaccine. One dose is recommended after age 82. Talk to your health care provider about which screenings and vaccines you need and how often you need them. This information is not intended to replace advice given to you by your health care provider. Make sure you discuss any questions you have with your health care provider. Document Released: 10/13/2015 Document Revised: 06/05/2016 Document Reviewed: 07/18/2015 Elsevier Interactive Patient Education  2017 [place] Prevention in the Home Falls can cause injuries. They can happen to people of all ages. There are many things you can do to make your home safe and to help prevent falls. What can I do on the outside of my home? Regularly fix the edges of walkways and driveways and fix any cracks. Remove anything that might make you trip as you walk through a door, such as a raised step or threshold. Trim any bushes or trees on the path to your home. Use bright outdoor lighting. Clear any walking paths of anything that might make someone trip, such as rocks or tools. Regularly check to see if handrails are loose or broken. Make sure that both sides of any steps have handrails. Any raised decks and porches should have guardrails on the edges. Have any leaves, snow, or ice cleared regularly. Use sand or salt on walking paths during winter. Clean up any spills in your garage right away. This includes oil or grease spills. What can I do in the bathroom? Use night lights. Install grab bars by the toilet and in the tub and shower. Do not use towel bars as grab bars. Use non-skid mats or decals in the tub or  shower. If you need to sit down in the shower, use a plastic, non-slip stool. Keep the floor dry. Clean up any water that spills on the floor as soon as it happens. Remove soap buildup in the tub or shower regularly. Attach bath mats securely with double-sided non-slip rug tape. Do not have throw rugs and other things on the floor that can make you trip. What can I do in the bedroom? Use night lights. Make sure that you have a light by your bed that is easy to reach. Do not use any sheets or blankets that are too big for your bed. They should not hang down onto the floor. Have a firm chair that has side arms. You can use this for support while you get dressed. Do not have throw rugs and other things on the floor that can make you trip. What can I do in the kitchen? Clean up any spills right away. Avoid walking on wet floors. Keep items that you use a lot in easy-to-reach places. If you need to reach something above you, use a strong step stool that has a grab bar. Keep electrical cords out of the way. Do not use floor polish or wax that makes floors slippery. If you must use wax, use non-skid floor wax. Do not have throw rugs and other things on the floor that can make you trip. What can I do  with my stairs? Do not leave any items on the stairs. Make sure that there are handrails on both sides of the stairs and use them. Fix handrails that are broken or loose. Make sure that handrails are as long as the stairways. Check any carpeting to make sure that it is firmly attached to the stairs. Fix any carpet that is loose or worn. Avoid having throw rugs at the top or bottom of the stairs. If you do have throw rugs, attach them to the floor with carpet tape. Make sure that you have a light switch at the top of the stairs and the bottom of the stairs. If you do not have them, ask someone to add them for you. What else can I do to help prevent falls? Wear shoes that: Do not have high heels. Have  rubber bottoms. Are comfortable and fit you well. Are closed at the toe. Do not wear sandals. If you use a stepladder: Make sure that it is fully opened. Do not climb a closed stepladder. Make sure that both sides of the stepladder are locked into place. Ask someone to hold it for you, if possible. Clearly mark and make sure that you can see: Any grab bars or handrails. First and last steps. Where the edge of each step is. Use tools that help you move around (mobility aids) if they are needed. These include: Canes. Walkers. Scooters. Crutches. Turn on the lights when you go into a dark area. Replace any light bulbs as soon as they burn out. Set up your furniture so you have a clear path. Avoid moving your furniture around. If any of your floors are uneven, fix them. If there are any pets around you, be aware of where they are. Review your medicines with your doctor. Some medicines can make you feel dizzy. This can increase your chance of falling. Ask your doctor what other things that you can do to help prevent falls. This information is not intended to replace advice given to you by your health care provider. Make sure you discuss any questions you have with your health care provider. Document Released: 07/13/2009 Document Revised: 02/22/2016 Document Reviewed: 10/21/2014 Elsevier Interactive Patient Education  2017 [employer].

## 2021-08-08 ENCOUNTER — Other Ambulatory Visit (HOSPITAL_COMMUNITY)
Admission: RE | Admit: 2021-08-08 | Discharge: 2021-08-08 | Disposition: A | Discharge status: Home / Self Care | Payer: Medicare Other | Source: Ambulatory Visit | Attending: Nephrology | Admitting: Nephrology

## 2021-08-08 DIAGNOSIS — E559 Vitamin D deficiency, unspecified: Secondary | ICD-10-CM | POA: Insufficient documentation

## 2021-08-08 DIAGNOSIS — E871 Hypo-osmolality and hyponatremia: Secondary | ICD-10-CM | POA: Diagnosis not present

## 2021-08-08 DIAGNOSIS — N170 Acute kidney failure with tubular necrosis: Secondary | ICD-10-CM | POA: Diagnosis not present

## 2021-08-08 DIAGNOSIS — R809 Proteinuria, unspecified: Secondary | ICD-10-CM | POA: Insufficient documentation

## 2021-08-08 DIAGNOSIS — I10 Essential (primary) hypertension: Secondary | ICD-10-CM | POA: Insufficient documentation

## 2021-08-08 LAB — RENAL FUNCTION PANEL
Albumin: 4.2 g/dL (ref 3.5–5.0)
Anion gap: 8 (ref 5–15)
BUN: 16 mg/dL (ref 8–23)
CO2: 29 mmol/L (ref 22–32)
Calcium: 8.8 mg/dL — ABNORMAL LOW (ref 8.9–10.3)
Chloride: 99 mmol/L (ref 98–111)
Creatinine, Ser: 1.38 mg/dL — ABNORMAL HIGH (ref 0.61–1.24)
GFR, Estimated: 54 mL/min — ABNORMAL LOW (ref >60)
Glucose, Bld: 109 mg/dL — ABNORMAL HIGH (ref 70–99)
Phosphorus: 2.5 mg/dL (ref 2.5–4.6)
Potassium: 3.8 mmol/L (ref 3.5–5.1)
Sodium: 136 mmol/L (ref 135–145)

## 2021-10-04 DIAGNOSIS — E871 Hypo-osmolality and hyponatremia: Secondary | ICD-10-CM | POA: Diagnosis not present

## 2021-10-04 DIAGNOSIS — D519 Vitamin B12 deficiency anemia, unspecified: Secondary | ICD-10-CM | POA: Diagnosis not present

## 2021-10-04 DIAGNOSIS — E559 Vitamin D deficiency, unspecified: Secondary | ICD-10-CM | POA: Diagnosis not present

## 2021-10-04 DIAGNOSIS — N170 Acute kidney failure with tubular necrosis: Secondary | ICD-10-CM | POA: Diagnosis not present

## 2021-10-04 DIAGNOSIS — Z79899 Other long term (current) drug therapy: Secondary | ICD-10-CM | POA: Diagnosis not present

## 2021-10-04 DIAGNOSIS — I10 Essential (primary) hypertension: Secondary | ICD-10-CM | POA: Diagnosis not present

## 2021-10-04 DIAGNOSIS — R809 Proteinuria, unspecified: Secondary | ICD-10-CM | POA: Diagnosis not present

## 2021-10-25 DIAGNOSIS — R809 Proteinuria, unspecified: Secondary | ICD-10-CM | POA: Diagnosis not present

## 2021-10-25 DIAGNOSIS — E871 Hypo-osmolality and hyponatremia: Secondary | ICD-10-CM | POA: Diagnosis not present

## 2021-10-25 DIAGNOSIS — I10 Essential (primary) hypertension: Secondary | ICD-10-CM | POA: Diagnosis not present

## 2021-10-25 DIAGNOSIS — N1831 Chronic kidney disease, stage 3a: Secondary | ICD-10-CM | POA: Diagnosis not present

## 2021-11-09 ENCOUNTER — Other Ambulatory Visit: Payer: Self-pay | Admitting: Family Medicine

## 2021-12-10 ENCOUNTER — Ambulatory Visit (INDEPENDENT_AMBULATORY_CARE_PROVIDER_SITE_OTHER): Payer: Medicare Other | Admitting: Family Medicine

## 2021-12-10 ENCOUNTER — Encounter: Payer: Self-pay | Admitting: Family Medicine

## 2021-12-10 VITALS — BP 134/82 | HR 61 | Ht 72.0 in | Wt 314.0 lb

## 2021-12-10 DIAGNOSIS — Z23 Encounter for immunization: Secondary | ICD-10-CM

## 2021-12-10 DIAGNOSIS — G4700 Insomnia, unspecified: Secondary | ICD-10-CM

## 2021-12-10 DIAGNOSIS — Z1211 Encounter for screening for malignant neoplasm of colon: Secondary | ICD-10-CM | POA: Diagnosis not present

## 2021-12-10 DIAGNOSIS — N1832 Chronic kidney disease, stage 3b: Secondary | ICD-10-CM

## 2021-12-10 DIAGNOSIS — I10 Essential (primary) hypertension: Secondary | ICD-10-CM | POA: Diagnosis not present

## 2021-12-10 DIAGNOSIS — E782 Mixed hyperlipidemia: Secondary | ICD-10-CM

## 2021-12-10 DIAGNOSIS — F339 Major depressive disorder, recurrent, unspecified: Secondary | ICD-10-CM | POA: Diagnosis not present

## 2021-12-10 NOTE — Progress Notes (Signed)
? ?BP 134/82   Pulse 61   Ht 6' (1.829 m)   Wt (!) 314 lb (142.4 kg)   SpO2 99%   BMI 42.59 kg/m?   ? ?Subjective:  ? ?Patient ID: Martin Schultz, male    DOB: 08-31-1951, 71 y.o.   MRN: [id number] ? ?HPI: ?Martin Schultz is a 71 y.o. male presenting on 12/10/2021 for Medical Management of Chronic Issues, Hypertension, and Hyperlipidemia ? ? ?HPI ?Hypertension and CKD ?Patient is currently on propranolol and lisinopril hydrochlorothiazide and hydralazine and needs amlodipine, and their blood pressure today is 134/82. Patient denies any lightheadedness or dizziness. Patient denies headaches, blurred vision, chest pains, shortness of breath, or weakness. Denies any side effects from medication and is content with current medication.  ? ?Hyperlipidemia ?Patient is coming in for recheck of his hyperlipidemia. The patient is currently taking pravastatin. They deny any issues with myalgias or history of liver damage from it. They deny any focal numbness or weakness or chest pain.  ? ?Insomnia and depression recheck ?Patient is coming in for insomnia and depression recheck.  He is doing well with that.  He currently takes the Lexapro and the Remeron and denies any major issues or mood swings. ? ?Relevant past medical, surgical, family and social history reviewed and updated as indicated. Interim medical history since our last visit reviewed. ?Allergies and medications reviewed and updated. ? ?Review of Systems  ?Constitutional:  Negative for chills and fever.  ?Eyes:  Negative for visual disturbance.  ?Respiratory:  Negative for shortness of breath and wheezing.   ?Cardiovascular:  Negative for chest pain and leg swelling.  ?Musculoskeletal:  Negative for back pain and gait problem.  ?Skin:  Negative for rash.  ?Neurological:  Negative for dizziness, weakness and light-headedness.  ?Psychiatric/Behavioral:  Negative for decreased concentration, self-injury, sleep disturbance and suicidal ideas. The patient is not  nervous/anxious.   ?All other systems reviewed and are negative. ? ?Per HPI unless specifically indicated above ? ? ?Allergies as of 12/10/2021   ? ?   Reactions  ? Trazodone And Nefazodone Other (See Comments)  ? Hallucination  ? Doxycycline   ? Kidney concerns  ? Benadryl [diphenhydramine] Other (See Comments)  ? Shakes, tremor  ? ?  ? ?  ?Medication List  ?  ? ?  ? Accurate as of December 10, 2021 11:38 AM. If you have any questions, ask your nurse or doctor.  ?  ?  ? ?  ? ?albuterol 108 (90 Base) MCG/ACT inhaler ?Commonly known as: Ventolin HFA ?INHALE 2 PUFFS 4 TIMES DAILY. ?  ?amLODipine 10 MG tablet ?Commonly known as: NORVASC ?Take 1 tablet (10 mg total) by mouth daily. ?  ?aspirin EC 81 MG tablet ?Take 81 mg by mouth daily. ?  ?augmented betamethasone dipropionate 0.05 % cream ?Commonly known as: DIPROLENE-AF ?Apply topically. ?  ?budesonide-formoterol 160-4.5 MCG/ACT inhaler ?Commonly known as: Symbicort ?Inhale 2 puffs into the lungs in the morning and at bedtime. ?  ?cetirizine 10 MG tablet ?Commonly known as: ZYRTEC ?TAKE (1) TABLET BY MOUTH ONCE DAILY. ?  ?cholecalciferol 1000 units tablet ?Commonly known as: VITAMIN D ?Take 1,000 Units by mouth daily. ?  ?escitalopram 20 MG tablet ?Commonly known as: LEXAPRO ?Take 1 tablet (20 mg total) by mouth at bedtime. ?  ?famotidine 20 MG tablet ?Commonly known as: PEPCID ?Take 1 tablet (20 mg total) by mouth 2 (two) times daily. ?  ?fluticasone 50 MCG/ACT nasal spray ?Commonly known as: FLONASE ?INSTILL 1 SPRAY  EACH NOSTRIL ONCE DAILY. ?  ?guaiFENesin 600 MG 12 hr tablet ?Commonly known as: Mucinex ?Take 1 tablet (600 mg total) by mouth 2 (two) times daily. ?  ?hydrALAZINE 50 MG tablet ?Commonly known as: APRESOLINE ?Take 50 mg by mouth 2 (two) times daily. ?  ?lisinopril-hydrochlorothiazide 20-25 MG tablet ?Commonly known as: ZESTORETIC ?Take 1 tablet by mouth daily. ?  ?metroNIDAZOLE 0.75 % gel ?Commonly known as: METROGEL ?Apply 1 application topically at  bedtime. ?  ?mirtazapine 15 MG tablet ?Commonly known as: REMERON ?Take 1 tablet (15 mg total) by mouth at bedtime. ?  ?olopatadine 0.1 % ophthalmic solution ?Commonly known as: PATANOL ?INSTILL 1 DROP INTO AFFECTED EYE (S) TWICE DAILY. ?  ?pravastatin 80 MG tablet ?Commonly known as: PRAVACHOL ?Take 1 tablet (80 mg total) by mouth at bedtime. ?  ?propranolol 10 MG tablet ?Commonly known as: INDERAL ?Take 1 tablet (10 mg total) by mouth 2 (two) times daily. To help headache and blood pressure ?  ? ?  ? ? ? ?Objective:  ? ?BP 134/82   Pulse 61   Ht 6' (1.829 m)   Wt (!) 314 lb (142.4 kg)   SpO2 99%   BMI 42.59 kg/m?   ?Wt Readings from Last 3 Encounters:  ?12/10/21 (!) 314 lb (142.4 kg)  ?08/02/21 (!) 313 lb (142 kg)  ?06/12/21 (!) 313 lb (142 kg)  ?  ?Physical Exam ?Vitals and nursing note reviewed.  ?Constitutional:   ?   General: He is not in acute distress. ?   Appearance: He is well-developed. He is not diaphoretic.  ?Eyes:  ?   General: No scleral icterus.    ?   Right eye: No discharge.  ?   Conjunctiva/sclera: Conjunctivae normal.  ?   Pupils: Pupils are equal, round, and reactive to light.  ?Neck:  ?   Thyroid: No thyromegaly.  ?Cardiovascular:  ?   Rate and Rhythm: Normal rate and regular rhythm.  ?   Heart sounds: Normal heart sounds. No murmur heard. ?Pulmonary:  ?   Effort: Pulmonary effort is normal. No respiratory distress.  ?   Breath sounds: Normal breath sounds. No wheezing.  ?Musculoskeletal:     ?   General: Swelling (Trace bilateral lower extremity edema) present.  ?   Cervical back: Neck supple.  ?Lymphadenopathy:  ?   Cervical: No cervical adenopathy.  ?Skin: ?   General: Skin is warm and dry.  ?   Findings: No rash.  ?Neurological:  ?   Mental Status: He is alert and oriented to person, place, and time.  ?   Coordination: Coordination normal.  ?Psychiatric:     ?   Behavior: Behavior normal.  ? ? ? ? ?Assessment & Plan:  ? ?Problem List Items Addressed This Visit   ? ?  ? Cardiovascular  and Mediastinum  ? Essential hypertension - Primary  ? Relevant Medications  ? hydrALAZINE (APRESOLINE) 50 MG tablet  ? Other Relevant Orders  ? CBC with Differential/Platelet  ? CMP14+EGFR  ?  ? Genitourinary  ? Chronic kidney disease, stage 3 unspecified ([place])  ? Relevant Orders  ? CBC with Differential/Platelet  ? CMP14+EGFR  ?  ? Other  ? HLD (hyperlipidemia)  ? Relevant Medications  ? hydrALAZINE (APRESOLINE) 50 MG tablet  ? Other Relevant Orders  ? Lipid panel  ? Depression, recurrent ([place])  ? Relevant Orders  ? CBC with Differential/Platelet  ? Insomnia  ? ?Other Visit Diagnoses   ? ? Colon cancer  screening      ? Relevant Orders  ? Cologuard  ? Need for immunization against influenza      ? Relevant Orders  ? Flu Vaccine QUAD High Dose(Fluad) (Completed)  ? ?  ?  ?Patient says he has nausea with hydralazine, recommended and try cut in half and getting used to it and then increasing if needed, keep close eye on his blood pressure. ? ?Blood pressure looks good today.  He continues to work with nephrology office. ?Follow up plan: ?Return in about 6 months (around 06/12/2022), or if symptoms worsen or fail to improve, for Hypertension and CKD and hld. ? ?Counseling provided for all of the vaccine components ?Orders Placed This Encounter  ?Procedures  ? Flu Vaccine QUAD High Dose(Fluad)  ? Cologuard  ? CBC with Differential/Platelet  ? CMP14+EGFR  ? Lipid panel  ? ? ?Caryl Pina, MD ?[place] ?12/10/2021, 11:38 AM ? ? ? ? ?

## 2021-12-11 LAB — LIPID PANEL
Chol/HDL Ratio: 3.8 ratio (ref 0.0–5.0)
Cholesterol, Total: 172 mg/dL (ref 100–199)
HDL: 45 mg/dL (ref >39)
LDL Chol Calc (NIH): 90 mg/dL (ref 0–99)
Triglycerides: 220 mg/dL — ABNORMAL HIGH (ref 0–149)
VLDL Cholesterol Cal: 37 mg/dL (ref 5–40)

## 2021-12-11 LAB — CMP14+EGFR
ALT: 23 IU/L (ref 0–44)
AST: 21 IU/L (ref 0–40)
Albumin/Globulin Ratio: 1.6 (ref 1.2–2.2)
Albumin: 4.7 g/dL (ref 3.7–4.7)
Alkaline Phosphatase: 61 IU/L (ref 44–121)
BUN/Creatinine Ratio: 12 (ref 10–24)
BUN: 18 mg/dL (ref 8–27)
Bilirubin Total: 0.7 mg/dL (ref 0.0–1.2)
CO2: 26 mmol/L (ref 20–29)
Calcium: 9.7 mg/dL (ref 8.6–10.2)
Chloride: 100 mmol/L (ref 96–106)
Creatinine, Ser: 1.46 mg/dL — ABNORMAL HIGH (ref 0.76–1.27)
Globulin, Total: 2.9 g/dL (ref 1.5–4.5)
Glucose: 112 mg/dL — ABNORMAL HIGH (ref 70–99)
Potassium: 4.6 mmol/L (ref 3.5–5.2)
Sodium: 140 mmol/L (ref 134–144)
Total Protein: 7.6 g/dL (ref 6.0–8.5)
eGFR: 51 mL/min/{1.73_m2} — ABNORMAL LOW (ref >59)

## 2021-12-11 LAB — CBC WITH DIFFERENTIAL/PLATELET
Basophils Absolute: 0.1 10*3/uL (ref 0.0–0.2)
Basos: 1 %
EOS (ABSOLUTE): 0.5 10*3/uL — ABNORMAL HIGH (ref 0.0–0.4)
Eos: 8 %
Hematocrit: 49.7 % (ref 37.5–51.0)
Hemoglobin: 15.9 g/dL (ref 13.0–17.7)
Immature Grans (Abs): 0 10*3/uL (ref 0.0–0.1)
Immature Granulocytes: 0 %
Lymphocytes Absolute: 2.3 10*3/uL (ref 0.7–3.1)
Lymphs: 34 %
MCH: 27.7 pg (ref 26.6–33.0)
MCHC: 32 g/dL (ref 31.5–35.7)
MCV: 87 fL (ref 79–97)
Monocytes Absolute: 0.6 10*3/uL (ref 0.1–0.9)
Monocytes: 9 %
Neutrophils Absolute: 3.1 10*3/uL (ref 1.4–7.0)
Neutrophils: 48 %
Platelets: 325 10*3/uL (ref 150–450)
RBC: 5.74 x10E6/uL (ref 4.14–5.80)
RDW: 12.2 % (ref 11.6–15.4)
WBC: 6.6 10*3/uL (ref 3.4–10.8)

## 2021-12-19 DIAGNOSIS — Z1211 Encounter for screening for malignant neoplasm of colon: Secondary | ICD-10-CM | POA: Diagnosis not present

## 2021-12-28 DIAGNOSIS — Z20822 Contact with and (suspected) exposure to covid-19: Secondary | ICD-10-CM | POA: Diagnosis not present

## 2021-12-30 LAB — COLOGUARD: COLOGUARD: POSITIVE — AB

## 2021-12-31 NOTE — Progress Notes (Signed)
Aware and referral placed  ?

## 2021-12-31 NOTE — Addendum Note (Signed)
Addended by: Nigel Berthold C on: 12/31/2021 10:04 AM ? ? Modules accepted: Orders ? ?

## 2022-01-07 ENCOUNTER — Encounter: Payer: Self-pay | Admitting: Internal Medicine

## 2022-01-10 ENCOUNTER — Other Ambulatory Visit: Payer: Self-pay | Admitting: Family Medicine

## 2022-01-18 DIAGNOSIS — E871 Hypo-osmolality and hyponatremia: Secondary | ICD-10-CM | POA: Diagnosis not present

## 2022-01-18 DIAGNOSIS — N1831 Chronic kidney disease, stage 3a: Secondary | ICD-10-CM | POA: Diagnosis not present

## 2022-01-18 DIAGNOSIS — I10 Essential (primary) hypertension: Secondary | ICD-10-CM | POA: Diagnosis not present

## 2022-01-18 DIAGNOSIS — R809 Proteinuria, unspecified: Secondary | ICD-10-CM | POA: Diagnosis not present

## 2022-01-23 DIAGNOSIS — Z20822 Contact with and (suspected) exposure to covid-19: Secondary | ICD-10-CM | POA: Diagnosis not present

## 2022-01-24 DIAGNOSIS — N1831 Chronic kidney disease, stage 3a: Secondary | ICD-10-CM | POA: Diagnosis not present

## 2022-01-24 DIAGNOSIS — I129 Hypertensive chronic kidney disease with stage 1 through stage 4 chronic kidney disease, or unspecified chronic kidney disease: Secondary | ICD-10-CM | POA: Diagnosis not present

## 2022-01-24 DIAGNOSIS — R809 Proteinuria, unspecified: Secondary | ICD-10-CM | POA: Diagnosis not present

## 2022-02-01 DIAGNOSIS — Z20822 Contact with and (suspected) exposure to covid-19: Secondary | ICD-10-CM | POA: Diagnosis not present

## 2022-02-07 ENCOUNTER — Telehealth: Payer: Self-pay | Admitting: Family Medicine

## 2022-02-07 NOTE — Telephone Encounter (Signed)
Pts social worker [employer] technologist) called from social services stating that she needs an FL2 for patient, sent to her today.  ? ?Says she was audited today unknowingly, and needs FL2 ASAP. ? ?Wants to know if Dr Warrick Parisian can make exception without seeing pt for this, and send to her? ? ?Fax# [phone] ?Phone# [phone] Ext [phone] ?

## 2022-03-05 ENCOUNTER — Encounter: Payer: Self-pay | Admitting: *Deleted

## 2022-03-11 ENCOUNTER — Ambulatory Visit: Payer: Medicare Other

## 2022-04-05 ENCOUNTER — Other Ambulatory Visit: Payer: Self-pay | Admitting: Family Medicine

## 2022-04-05 DIAGNOSIS — J4520 Mild intermittent asthma, uncomplicated: Secondary | ICD-10-CM

## 2022-04-18 DIAGNOSIS — I129 Hypertensive chronic kidney disease with stage 1 through stage 4 chronic kidney disease, or unspecified chronic kidney disease: Secondary | ICD-10-CM | POA: Diagnosis not present

## 2022-04-18 DIAGNOSIS — N1831 Chronic kidney disease, stage 3a: Secondary | ICD-10-CM | POA: Diagnosis not present

## 2022-04-18 DIAGNOSIS — R809 Proteinuria, unspecified: Secondary | ICD-10-CM | POA: Diagnosis not present

## 2022-05-02 DIAGNOSIS — I129 Hypertensive chronic kidney disease with stage 1 through stage 4 chronic kidney disease, or unspecified chronic kidney disease: Secondary | ICD-10-CM | POA: Diagnosis not present

## 2022-05-02 DIAGNOSIS — N1832 Chronic kidney disease, stage 3b: Secondary | ICD-10-CM | POA: Diagnosis not present

## 2022-05-02 DIAGNOSIS — R809 Proteinuria, unspecified: Secondary | ICD-10-CM | POA: Diagnosis not present

## 2022-05-02 DIAGNOSIS — N481 Balanitis: Secondary | ICD-10-CM | POA: Diagnosis not present

## 2022-06-04 ENCOUNTER — Other Ambulatory Visit: Payer: Self-pay | Admitting: Family Medicine

## 2022-06-04 DIAGNOSIS — F339 Major depressive disorder, recurrent, unspecified: Secondary | ICD-10-CM

## 2022-06-04 DIAGNOSIS — G4700 Insomnia, unspecified: Secondary | ICD-10-CM

## 2022-06-04 DIAGNOSIS — J4520 Mild intermittent asthma, uncomplicated: Secondary | ICD-10-CM

## 2022-06-12 ENCOUNTER — Ambulatory Visit (INDEPENDENT_AMBULATORY_CARE_PROVIDER_SITE_OTHER): Payer: Medicare Other | Admitting: Family Medicine

## 2022-06-12 ENCOUNTER — Encounter: Payer: Self-pay | Admitting: Family Medicine

## 2022-06-12 VITALS — BP 130/69 | HR 61 | Temp 97.9°F | Ht 72.0 in | Wt 312.0 lb

## 2022-06-12 DIAGNOSIS — Z23 Encounter for immunization: Secondary | ICD-10-CM

## 2022-06-12 DIAGNOSIS — I10 Essential (primary) hypertension: Secondary | ICD-10-CM

## 2022-06-12 DIAGNOSIS — J4520 Mild intermittent asthma, uncomplicated: Secondary | ICD-10-CM | POA: Diagnosis not present

## 2022-06-12 DIAGNOSIS — F339 Major depressive disorder, recurrent, unspecified: Secondary | ICD-10-CM | POA: Diagnosis not present

## 2022-06-12 DIAGNOSIS — N1832 Chronic kidney disease, stage 3b: Secondary | ICD-10-CM | POA: Diagnosis not present

## 2022-06-12 DIAGNOSIS — E782 Mixed hyperlipidemia: Secondary | ICD-10-CM

## 2022-06-12 DIAGNOSIS — G4700 Insomnia, unspecified: Secondary | ICD-10-CM

## 2022-06-12 MED ORDER — AMLODIPINE BESYLATE 10 MG PO TABS: ORAL_TABLET | ORAL | 1 refills | Status: DC

## 2022-06-12 MED ORDER — MIRTAZAPINE 15 MG PO TABS: 15.0000 mg | ORAL_TABLET | Freq: Every day | ORAL | 1 refills | Status: DC

## 2022-06-12 MED ORDER — ALBUTEROL SULFATE HFA 108 (90 BASE) MCG/ACT IN AERS: INHALATION_SPRAY | RESPIRATORY_TRACT | 3 refills | Status: DC

## 2022-06-12 MED ORDER — BUDESONIDE-FORMOTEROL FUMARATE 160-4.5 MCG/ACT IN AERO: INHALATION_SPRAY | RESPIRATORY_TRACT | 3 refills | Status: DC

## 2022-06-12 MED ORDER — OLOPATADINE HCL 0.1 % OP SOLN: OPHTHALMIC | 11 refills | Status: DC

## 2022-06-12 MED ORDER — PROPRANOLOL HCL 10 MG PO TABS: ORAL_TABLET | ORAL | 3 refills | Status: DC

## 2022-06-12 MED ORDER — FAMOTIDINE 20 MG PO TABS: 20.0000 mg | ORAL_TABLET | Freq: Two times a day (BID) | ORAL | 1 refills | Status: DC

## 2022-06-12 MED ORDER — ESCITALOPRAM OXALATE 20 MG PO TABS: 20.0000 mg | ORAL_TABLET | Freq: Every day | ORAL | 1 refills | Status: DC

## 2022-06-12 MED ORDER — PRAVASTATIN SODIUM 80 MG PO TABS: 80.0000 mg | ORAL_TABLET | Freq: Every day | ORAL | 0 refills | Status: DC

## 2022-06-12 MED ORDER — LISINOPRIL-HYDROCHLOROTHIAZIDE 20-25 MG PO TABS: ORAL_TABLET | ORAL | 1 refills | Status: DC

## 2022-06-12 NOTE — Progress Notes (Signed)
BP 130/69   Pulse 61   Temp 97.9 F (36.6 C)   Ht 6' (1.829 m)   Wt (!) 312 lb (141.5 kg)   SpO2 96%   BMI 42.31 kg/m    Subjective:   Patient ID: Martin Schultz, male    DOB: 06-05-1951, 71 y.o.   MRN: [id number]  HPI: Martin Schultz is a 71 y.o. male presenting on 06/12/2022 for Medical Management of Chronic Issues, Hyperlipidemia, Hypertension, and Staying nervous   HPI Hypertension and CKD 3 Patient is currently on amlodipine and lisinopril hydrochlorothiazide and propranolol and hydralazine, and their blood pressure today is 130/69. Patient denies any lightheadedness or dizziness. Patient denies headaches, blurred vision, chest pains, shortness of breath, or weakness. Denies any side effects from medication and is content with current medication.   Hyperlipidemia Patient is coming in for recheck of his hyperlipidemia. The patient is currently taking pravastatin. They deny any issues with myalgias or history of liver damage from it. They deny any focal numbness or weakness or chest pain.   Asthma in adult recheck Patient has adult asthma and currently uses albuterol as needed and Symbicort.  He denies any major wheezing breathing episodes.  He does not use the albuterol very frequently at all.  Depression and insomnia and anxiety recheck Patient currently takes Lexapro to help with depression and anxiety and mirtazapine would also help and to help with insomnia.  Patient says he does get nervous at sometimes but then when talking further about it he says is mainly when he is out in the heat and then he gets a little shaky, he does admit that he is been out push walking more often and does not always take water with him.  He denies any more stress or worries in his mind.  Relevant past medical, surgical, family and social history reviewed and updated as indicated. Interim medical history since our last visit reviewed. Allergies and medications reviewed and updated.  Review of  Systems  Constitutional:  Negative for chills and fever.  Respiratory:  Negative for shortness of breath and wheezing.   Cardiovascular:  Negative for chest pain and leg swelling.  Musculoskeletal:  Negative for back pain and gait problem.  Skin:  Negative for rash.  Neurological:  Negative for dizziness, weakness and light-headedness.  All other systems reviewed and are negative.   Per HPI unless specifically indicated above   Allergies as of 06/12/2022       Reactions   Trazodone And Nefazodone Other (See Comments)   Hallucination   Doxycycline    Kidney concerns   Benadryl [diphenhydramine] Other (See Comments)   Shakes, tremor        Medication List        Accurate as of June 12, 2022  9:50 AM. If you have any questions, ask your nurse or doctor.          STOP taking these medications    augmented betamethasone dipropionate 0.05 % cream Commonly known as: DIPROLENE-AF Stopped by: Fransisca Kaufmann Breckan Cafiero, MD   guaiFENesin 600 MG 12 hr tablet Commonly known as: Mucinex Stopped by: Fransisca Kaufmann Willisha Sligar, MD       TAKE these medications    albuterol 108 (90 Base) MCG/ACT inhaler Commonly known as: Ventolin HFA INHALE 2 PUFFS 4 TIMES DAILY.   amLODipine 10 MG tablet Commonly known as: NORVASC TAKE (1) TABLET BY MOUTH ONCE DAILY.   aspirin EC 81 MG tablet Take 81 mg by mouth daily.  budesonide-formoterol 160-4.5 MCG/ACT inhaler Commonly known as: Symbicort INHALE 2 PUFFS INTO THE LUNGS EVERY MORNING AND AT BEDTIME.   cetirizine 10 MG tablet Commonly known as: ZYRTEC TAKE (1) TABLET BY MOUTH ONCE DAILY.   cholecalciferol 1000 units tablet Commonly known as: VITAMIN D Take 1,000 Units by mouth daily.   escitalopram 20 MG tablet Commonly known as: LEXAPRO Take 1 tablet (20 mg total) by mouth at bedtime.   famotidine 20 MG tablet Commonly known as: PEPCID Take 1 tablet (20 mg total) by mouth 2 (two) times daily.   fluticasone 50 MCG/ACT nasal  spray Commonly known as: FLONASE INSTILL 1 SPRAY EACH NOSTRIL ONCE DAILY.   hydrALAZINE 50 MG tablet Commonly known as: APRESOLINE Take 50 mg by mouth 2 (two) times daily.   lisinopril-hydrochlorothiazide 20-25 MG tablet Commonly known as: ZESTORETIC TAKE (1) TABLET BY MOUTH ONCE DAILY.   metroNIDAZOLE 0.75 % gel Commonly known as: METROGEL Apply 1 application topically at bedtime.   mirtazapine 15 MG tablet Commonly known as: REMERON Take 1 tablet (15 mg total) by mouth at bedtime.   olopatadine 0.1 % ophthalmic solution Commonly known as: PATANOL One drop into affected eye twice daily What changed: See the new instructions. Changed by: Fransisca Kaufmann [name], MD   pravastatin 80 MG tablet Commonly known as: PRAVACHOL Take 1 tablet (80 mg total) by mouth at bedtime.   propranolol 10 MG tablet Commonly known as: INDERAL TAKE 1 TABLET TWICE DAILY TO HELP HEADACHE AND BLOOD PRESSURE         Objective:   BP 130/69   Pulse 61   Temp 97.9 F (36.6 C)   Ht 6' (1.829 m)   Wt (!) 312 lb (141.5 kg)   SpO2 96%   BMI 42.31 kg/m   Wt Readings from Last 3 Encounters:  06/12/22 (!) 312 lb (141.5 kg)  12/10/21 (!) 314 lb (142.4 kg)  08/02/21 (!) 313 lb (142 kg)    Physical Exam Vitals and nursing note reviewed.  Constitutional:      General: He is not in acute distress.    Appearance: He is well-developed. He is not diaphoretic.  Eyes:     General: No scleral icterus.    Conjunctiva/sclera: Conjunctivae normal.  Neck:     Thyroid: No thyromegaly.  Cardiovascular:     Rate and Rhythm: Normal rate and regular rhythm.     Heart sounds: Normal heart sounds. No murmur heard. Pulmonary:     Effort: Pulmonary effort is normal. No respiratory distress.     Breath sounds: Normal breath sounds. No wheezing.  Musculoskeletal:        General: Normal range of motion.     Cervical back: Neck supple.  Lymphadenopathy:     Cervical: No cervical adenopathy.  Skin:    General:  Skin is warm and dry.     Findings: No rash.  Neurological:     Mental Status: He is alert and oriented to person, place, and time.     Coordination: Coordination normal.  Psychiatric:        Behavior: Behavior normal.       Assessment & Plan:   Problem List Items Addressed This Visit       Cardiovascular and Mediastinum   Essential hypertension   Relevant Medications   amLODipine (NORVASC) 10 MG tablet   lisinopril-hydrochlorothiazide (ZESTORETIC) 20-25 MG tablet   pravastatin (PRAVACHOL) 80 MG tablet   propranolol (INDERAL) 10 MG tablet   Other Relevant Orders  CBC with Differential/Platelet   CMP14+EGFR   Lipid panel     Respiratory   Asthma in adult, mild intermittent, uncomplicated   Relevant Medications   albuterol (VENTOLIN HFA) 108 (90 Base) MCG/ACT inhaler   budesonide-formoterol (SYMBICORT) 160-4.5 MCG/ACT inhaler     Genitourinary   Chronic kidney disease, stage 3 unspecified ([facility]) - Primary   Relevant Orders   CBC with Differential/Platelet   CMP14+EGFR   Lipid panel     Other   HLD (hyperlipidemia)   Relevant Medications   amLODipine (NORVASC) 10 MG tablet   lisinopril-hydrochlorothiazide (ZESTORETIC) 20-25 MG tablet   pravastatin (PRAVACHOL) 80 MG tablet   propranolol (INDERAL) 10 MG tablet   Other Relevant Orders   CBC with Differential/Platelet   CMP14+EGFR   Lipid panel   Depression, recurrent ([facility])   Relevant Medications   escitalopram (LEXAPRO) 20 MG tablet   mirtazapine (REMERON) 15 MG tablet   Insomnia   Relevant Medications   escitalopram (LEXAPRO) 20 MG tablet   mirtazapine (REMERON) 15 MG tablet   Other Visit Diagnoses     Need for immunization against influenza       Relevant Orders   Flu Vaccine QUAD High Dose(Fluad) (Completed)       Blood pressure seems to be doing well.  Continue current medicine, recommended increased hydration.  Discussed nerves and he says he only gets shaky when he is in the heat. Follow up  plan: Return in about 6 months (around 12/11/2022), or if symptoms worsen or fail to improve, for Hypertension and hyperlipidemia and CKD.  Counseling provided for all of the vaccine components Orders Placed This Encounter  Procedures   Flu Vaccine QUAD High Dose(Fluad)   CBC with Differential/Platelet   CMP14+EGFR   Lipid panel    Caryl Pina, MD [facility] 06/12/2022, 9:50 AM

## 2022-06-13 LAB — CMP14+EGFR
ALT: 21 IU/L (ref 0–44)
AST: 19 IU/L (ref 0–40)
Albumin/Globulin Ratio: 1.5 (ref 1.2–2.2)
Albumin: 4.4 g/dL (ref 3.8–4.8)
Alkaline Phosphatase: 57 IU/L (ref 44–121)
BUN/Creatinine Ratio: 13 (ref 10–24)
BUN: 20 mg/dL (ref 8–27)
Bilirubin Total: 0.7 mg/dL (ref 0.0–1.2)
CO2: 24 mmol/L (ref 20–29)
Calcium: 9.5 mg/dL (ref 8.6–10.2)
Chloride: 101 mmol/L (ref 96–106)
Creatinine, Ser: 1.5 mg/dL — ABNORMAL HIGH (ref 0.76–1.27)
Globulin, Total: 2.9 g/dL (ref 1.5–4.5)
Glucose: 100 mg/dL — ABNORMAL HIGH (ref 70–99)
Potassium: 4.2 mmol/L (ref 3.5–5.2)
Sodium: 139 mmol/L (ref 134–144)
Total Protein: 7.3 g/dL (ref 6.0–8.5)
eGFR: 49 mL/min/{1.73_m2} — ABNORMAL LOW (ref >59)

## 2022-06-13 LAB — LIPID PANEL
Chol/HDL Ratio: 3.3 ratio (ref 0.0–5.0)
Cholesterol, Total: 150 mg/dL (ref 100–199)
HDL: 45 mg/dL (ref >39)
LDL Chol Calc (NIH): 74 mg/dL (ref 0–99)
Triglycerides: 184 mg/dL — ABNORMAL HIGH (ref 0–149)
VLDL Cholesterol Cal: 31 mg/dL (ref 5–40)

## 2022-06-13 LAB — CBC WITH DIFFERENTIAL/PLATELET
Basophils Absolute: 0.1 10*3/uL (ref 0.0–0.2)
Basos: 1 %
EOS (ABSOLUTE): 0.3 10*3/uL (ref 0.0–0.4)
Eos: 5 %
Hematocrit: 48.3 % (ref 37.5–51.0)
Hemoglobin: 16 g/dL (ref 13.0–17.7)
Immature Grans (Abs): 0 10*3/uL (ref 0.0–0.1)
Immature Granulocytes: 0 %
Lymphocytes Absolute: 2 10*3/uL (ref 0.7–3.1)
Lymphs: 31 %
MCH: 28.2 pg (ref 26.6–33.0)
MCHC: 33.1 g/dL (ref 31.5–35.7)
MCV: 85 fL (ref 79–97)
Monocytes Absolute: 0.6 10*3/uL (ref 0.1–0.9)
Monocytes: 10 %
Neutrophils Absolute: 3.4 10*3/uL (ref 1.4–7.0)
Neutrophils: 53 %
Platelets: 273 10*3/uL (ref 150–450)
RBC: 5.67 x10E6/uL (ref 4.14–5.80)
RDW: 12.5 % (ref 11.6–15.4)
WBC: 6.3 10*3/uL (ref 3.4–10.8)

## 2022-08-05 DIAGNOSIS — I129 Hypertensive chronic kidney disease with stage 1 through stage 4 chronic kidney disease, or unspecified chronic kidney disease: Secondary | ICD-10-CM | POA: Diagnosis not present

## 2022-08-05 DIAGNOSIS — R809 Proteinuria, unspecified: Secondary | ICD-10-CM | POA: Diagnosis not present

## 2022-08-05 DIAGNOSIS — N1832 Chronic kidney disease, stage 3b: Secondary | ICD-10-CM | POA: Diagnosis not present

## 2022-08-09 ENCOUNTER — Other Ambulatory Visit: Payer: Self-pay | Admitting: Family Medicine

## 2022-08-16 ENCOUNTER — Telehealth: Payer: Self-pay | Admitting: Family Medicine

## 2022-08-16 NOTE — Telephone Encounter (Signed)
Left message for patient to call back and schedule Medicare Annual Wellness Visit (AWV) to be completed by video or phone.   Last AWV: 08/02/2021   Please schedule at anytime with [place]     45 minute appointment  Any questions, please contact me at [phone]

## 2022-08-19 ENCOUNTER — Ambulatory Visit (INDEPENDENT_AMBULATORY_CARE_PROVIDER_SITE_OTHER): Payer: Medicare Other

## 2022-08-19 VITALS — Ht 73.0 in | Wt 313.0 lb

## 2022-08-19 DIAGNOSIS — Z0000 Encounter for general adult medical examination without abnormal findings: Secondary | ICD-10-CM

## 2022-08-19 NOTE — Patient Instructions (Signed)
Martin Schultz , Thank you for taking time to come for your Medicare Wellness Visit. I appreciate your ongoing commitment to your health goals. Please review the following plan we discussed and let me know if I can assist you in the future.   These are the goals we discussed:  Goals      Blood Pressure < 140/90     DIET - INCREASE WATER INTAKE     Exercise 150 minutes per week (moderate activity)     Weight (lb) < 200 lb (90.7 kg)        This is a list of the screening recommended for you and due dates:  Health Maintenance  Topic Date Due   COVID-19 Vaccine (2 - Janssen risk series) 12/09/2022*   Medicare Annual Wellness Visit  08/20/2023   Cologuard (Stool DNA test)  12/19/2024   Pneumonia Vaccine  Completed   Flu Shot  Completed   Hepatitis C Screening: USPSTF Recommendation to screen - Ages 18-79 yo.  Completed   Zoster (Shingles) Vaccine  Completed   HPV Vaccine  Aged Out  *Topic was postponed. The date shown is not the original due date.    Advanced directives: Advance directive discussed with you today. Even though you declined this today, please call our office should you change your mind, and we can give you the proper paperwork for you to fill out.   Conditions/risks identified: Aim for 30 minutes of exercise or brisk walking, 6-8 glasses of water, and 5 servings of fruits and vegetables each day.    Next appointment: Follow up in one year for your annual wellness visit.   Preventive Care 30 Years and Older, Male  Preventive care refers to lifestyle choices and visits with your health care provider that can promote health and wellness. What does preventive care include? A yearly physical exam. This is also called an annual well check. Dental exams once or twice a year. Routine eye exams. Ask your health care provider how often you should have your eyes checked. Personal lifestyle choices, including: Daily care of your teeth and gums. Regular physical activity. Eating  a healthy diet. Avoiding tobacco and drug use. Limiting alcohol use. Practicing safe sex. Taking low doses of aspirin every day. Taking vitamin and mineral supplements as recommended by your health care provider. What happens during an annual well check? The services and screenings done by your health care provider during your annual well check will depend on your age, overall health, lifestyle risk factors, and family history of disease. Counseling  Your health care provider may ask you questions about your: Alcohol use. Tobacco use. Drug use. Emotional well-being. Home and relationship well-being. Sexual activity. Eating habits. History of falls. Memory and ability to understand (cognition). Work and work Statistician. Screening  You may have the following tests or measurements: Height, weight, and BMI. Blood pressure. Lipid and cholesterol levels. These may be checked every 5 years, or more frequently if you are over 47 years old. Skin check. Lung cancer screening. You may have this screening every year starting at age 64 if you have a 30-pack-year history of smoking and currently smoke or have quit within the past 15 years. Fecal occult blood test (FOBT) of the stool. You may have this test every year starting at age 41. Flexible sigmoidoscopy or colonoscopy. You may have a sigmoidoscopy every 5 years or a colonoscopy every 10 years starting at age 68. Prostate cancer screening. Recommendations will vary depending on your family history  and other risks. Hepatitis C blood test. Hepatitis B blood test. Sexually transmitted disease (STD) testing. Diabetes screening. This is done by checking your blood sugar (glucose) after you have not eaten for a while (fasting). You may have this done every 1-3 years. Abdominal aortic aneurysm (AAA) screening. You may need this if you are a current or former smoker. Osteoporosis. You may be screened starting at age 41 if you are at high  risk. Talk with your health care provider about your test results, treatment options, and if necessary, the need for more tests. Vaccines  Your health care provider may recommend certain vaccines, such as: Influenza vaccine. This is recommended every year. Tetanus, diphtheria, and acellular pertussis (Tdap, Td) vaccine. You may need a Td booster every 10 years. Zoster vaccine. You may need this after age 37. Pneumococcal 13-valent conjugate (PCV13) vaccine. One dose is recommended after age 61. Pneumococcal polysaccharide (PPSV23) vaccine. One dose is recommended after age 37. Talk to your health care provider about which screenings and vaccines you need and how often you need them. This information is not intended to replace advice given to you by your health care provider. Make sure you discuss any questions you have with your health care provider. Document Released: 10/13/2015 Document Revised: 06/05/2016 Document Reviewed: 07/18/2015 Elsevier Interactive Patient Education  2017 [place] Prevention in the Home Falls can cause injuries. They can happen to people of all ages. There are many things you can do to make your home safe and to help prevent falls. What can I do on the outside of my home? Regularly fix the edges of walkways and driveways and fix any cracks. Remove anything that might make you trip as you walk through a door, such as a raised step or threshold. Trim any bushes or trees on the path to your home. Use bright outdoor lighting. Clear any walking paths of anything that might make someone trip, such as rocks or tools. Regularly check to see if handrails are loose or broken. Make sure that both sides of any steps have handrails. Any raised decks and porches should have guardrails on the edges. Have any leaves, snow, or ice cleared regularly. Use sand or salt on walking paths during winter. Clean up any spills in your garage right away. This includes oil or  grease spills. What can I do in the bathroom? Use night lights. Install grab bars by the toilet and in the tub and shower. Do not use towel bars as grab bars. Use non-skid mats or decals in the tub or shower. If you need to sit down in the shower, use a plastic, non-slip stool. Keep the floor dry. Clean up any water that spills on the floor as soon as it happens. Remove soap buildup in the tub or shower regularly. Attach bath mats securely with double-sided non-slip rug tape. Do not have throw rugs and other things on the floor that can make you trip. What can I do in the bedroom? Use night lights. Make sure that you have a light by your bed that is easy to reach. Do not use any sheets or blankets that are too big for your bed. They should not hang down onto the floor. Have a firm chair that has side arms. You can use this for support while you get dressed. Do not have throw rugs and other things on the floor that can make you trip. What can I do in the kitchen? Clean up any spills  right away. Avoid walking on wet floors. Keep items that you use a lot in easy-to-reach places. If you need to reach something above you, use a strong step stool that has a grab bar. Keep electrical cords out of the way. Do not use floor polish or wax that makes floors slippery. If you must use wax, use non-skid floor wax. Do not have throw rugs and other things on the floor that can make you trip. What can I do with my stairs? Do not leave any items on the stairs. Make sure that there are handrails on both sides of the stairs and use them. Fix handrails that are broken or loose. Make sure that handrails are as long as the stairways. Check any carpeting to make sure that it is firmly attached to the stairs. Fix any carpet that is loose or worn. Avoid having throw rugs at the top or bottom of the stairs. If you do have throw rugs, attach them to the floor with carpet tape. Make sure that you have a light switch  at the top of the stairs and the bottom of the stairs. If you do not have them, ask someone to add them for you. What else can I do to help prevent falls? Wear shoes that: Do not have high heels. Have rubber bottoms. Are comfortable and fit you well. Are closed at the toe. Do not wear sandals. If you use a stepladder: Make sure that it is fully opened. Do not climb a closed stepladder. Make sure that both sides of the stepladder are locked into place. Ask someone to hold it for you, if possible. Clearly mark and make sure that you can see: Any grab bars or handrails. First and last steps. Where the edge of each step is. Use tools that help you move around (mobility aids) if they are needed. These include: Canes. Walkers. Scooters. Crutches. Turn on the lights when you go into a dark area. Replace any light bulbs as soon as they burn out. Set up your furniture so you have a clear path. Avoid moving your furniture around. If any of your floors are uneven, fix them. If there are any pets around you, be aware of where they are. Review your medicines with your doctor. Some medicines can make you feel dizzy. This can increase your chance of falling. Ask your doctor what other things that you can do to help prevent falls. This information is not intended to replace advice given to you by your health care provider. Make sure you discuss any questions you have with your health care provider. Document Released: 07/13/2009 Document Revised: 02/22/2016 Document Reviewed: 10/21/2014 Elsevier Interactive Patient Education  2017 [employer].

## 2022-08-19 NOTE — Progress Notes (Signed)
Subjective:   Martin Schultz is a 71 y.o. male who presents for Medicare Annual/Subsequent preventive examination.  I connected with  Martin Schultz on 08/19/22 by a audio enabled telemedicine application and verified that I am speaking with the correct person using two identifiers.  Patient Location: Home  Provider Location: Office/Clinic  I discussed the limitations of evaluation and management by telemedicine. The patient expressed understanding and agreed to proceed.  Review of Systems     Cardiac Risk Factors include: advanced age (>74mn, >>75women);male gender;hypertension;dyslipidemia     Objective:    Today's Vitals   08/19/22 1315  Weight: (!) 313 lb (142 kg)  Height: '6\' 1"'$  (1.854 m)   Body mass index is 41.3 kg/m.     08/19/2022    1:22 PM 08/02/2021    1:36 PM 08/13/2018   11:33 AM  Advanced Directives  Does Patient Have a Medical Advance Directive? No No No  Would patient like information on creating a medical advance directive? No - Patient declined No - Patient declined No - Patient declined    Current Medications (verified) Outpatient Encounter Medications as of 08/19/2022  Medication Sig   albuterol (VENTOLIN HFA) 108 (90 Base) MCG/ACT inhaler INHALE 2 PUFFS 4 TIMES DAILY.   amLODipine (NORVASC) 10 MG tablet TAKE (1) TABLET BY MOUTH ONCE DAILY.   aspirin EC 81 MG tablet Take 81 mg by mouth daily.   budesonide-formoterol (SYMBICORT) 160-4.5 MCG/ACT inhaler INHALE 2 PUFFS INTO THE LUNGS EVERY MORNING AND AT BEDTIME.   cetirizine (ZYRTEC) 10 MG tablet TAKE (1) TABLET BY MOUTH ONCE DAILY.   cholecalciferol (VITAMIN D) 1000 units tablet Take 1,000 Units by mouth daily.   escitalopram (LEXAPRO) 20 MG tablet Take 1 tablet (20 mg total) by mouth at bedtime.   famotidine (PEPCID) 20 MG tablet Take 1 tablet (20 mg total) by mouth 2 (two) times daily.   fluticasone (FLONASE) 50 MCG/ACT nasal spray INSTILL 1 SPRAY EACH NOSTRIL ONCE DAILY.   hydrALAZINE  (APRESOLINE) 50 MG tablet Take 50 mg by mouth 2 (two) times daily.   lisinopril-hydrochlorothiazide (ZESTORETIC) 20-25 MG tablet TAKE (1) TABLET BY MOUTH ONCE DAILY.   metroNIDAZOLE (METROGEL) 0.75 % gel Apply 1 application topically at bedtime.   mirtazapine (REMERON) 15 MG tablet Take 1 tablet (15 mg total) by mouth at bedtime.   olopatadine (PATANOL) 0.1 % ophthalmic solution One drop into affected eye twice daily   pravastatin (PRAVACHOL) 80 MG tablet TAKE 1 TABLET BY MOUTH AT BEDTIME.   propranolol (INDERAL) 10 MG tablet TAKE 1 TABLET TWICE DAILY TO HELP HEADACHE AND BLOOD PRESSURE (Patient not taking: Reported on 08/19/2022)   No facility-administered encounter medications on file as of 08/19/2022.    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   Depression    GERD (gastroesophageal reflux disease)    Hyperlipidemia    Hypertension    Past Surgical History:  Procedure Laterality Date   APPENDECTOMY     FINGER AMPUTATION     HERNIA REPAIR     Family History  Problem Relation Age of Onset   Pneumonia Mother    Heart disease Father    Cancer  Father    Heart attack Father    COPD Sister    Cancer Brother        small intestine and lung   Pneumonia Brother    Cancer Brother    Social History   Socioeconomic History   Marital status: Single    Spouse name: Not on file   Number of children: 0   Years of education: 6   Highest education level: 6th grade  Occupational History   Occupation: retired  Tobacco Use   Smoking status: Never   Smokeless tobacco: Never  Scientific laboratory technician Use: Never used  Substance and Sexual Activity   Alcohol use: Not Currently    Comment: quit  20 years ago   Drug use: No   Sexual activity: Not Currently  Other Topics Concern   Not on file  Social History Narrative   Lives at home alone. He has two neighbors that he is close with and his niece checks in on him often. He also has a Education officer, museum that helps with transportation. "Congregates in the country store"   Social Determinants of Health   Financial Resource Strain: Low Risk  (08/19/2022)   Overall Financial Resource Strain (CARDIA)    Difficulty of Paying Living Expenses: Not hard at all  Food Insecurity: No Food Insecurity (08/19/2022)   Hunger Vital Sign    Worried About Running Out of Food in the Last Year: Never true    Ran Out of Food in the Last Year: Never true  Transportation Needs: No Transportation Needs (08/19/2022)   PRAPARE - Hydrologist (Medical): No    Lack of Transportation (Non-Medical): No  Physical Activity: Sufficiently Active (08/19/2022)   Exercise Vital Sign    Days of Exercise per Week: 7 days    Minutes of Exercise per Session: 60 min  Stress: No Stress Concern Present (08/19/2022)   [place]    Feeling of Stress : Not at all  Social Connections: Moderately Isolated (08/19/2022)   Social Connection and Isolation Panel [NHANES]    Frequency of Communication with Friends and Family: Never    Frequency of Social Gatherings with Friends and Family: Three times a week    Attends Religious Services: Never    Active Member of Clubs or Organizations: Yes    Attends Archivist Meetings: 1 to 4 times per year    Marital Status: Never married    Tobacco Counseling Counseling given: Not Answered   Clinical Intake:  Pre-visit preparation completed: Yes  Pain : No/denies pain     Nutritional Risks: None Diabetes: No  How often do you need to have someone help you when you read instructions, pamphlets, or other written materials from  your doctor or pharmacy?: 1 - Never  Diabetic?No   Interpreter Needed?: No  Comments: Martin Schultz   Activities of Daily Living    08/19/2022    1:26 PM  In your present state of health, do you have any difficulty performing the following activities:  Hearing? 0  Vision? 0  Difficulty concentrating or making decisions? 0  Walking or climbing stairs? 0  Dressing or bathing? 0  Doing errands, shopping? 0  Preparing Food and eating ? N  Using the Toilet? N  In the past six months, have you accidently leaked urine? N  Do you have problems with loss of bowel control? N  Managing your Medications?  N  Managing your Finances? N  Housekeeping or managing your Housekeeping? N    Patient Care Team: Dettinger, Fransisca Kaufmann, MD as PCP - General (Family Medicine) Liana Gerold, MD as Consulting Physician (Nephrology) Allyn Kenner, MD (Dermatology)  Indicate any recent Medical Services you may have received from other than [facility] providers in the past year (date may be approximate).     Assessment:   This is a routine wellness examination for Martin Schultz.  Hearing/Vision screen Hearing Screening - Comments:: No concerns  Vision Screening - Comments:: Does not wear prescription glasses   Dietary issues and exercise activities discussed: Current Exercise Habits: Home exercise routine, Type of exercise: walking, Time (Minutes): 30, Frequency (Times/Week): 7, Weekly Exercise (Minutes/Week): 210, Intensity: Moderate, Exercise limited by: None identified   Goals Addressed             This Visit's Progress    Exercise 150 minutes per week (moderate activity)   On track      Depression Screen    08/19/2022    1:21 PM 06/12/2022    9:25 AM 12/10/2021   11:21 AM 08/02/2021    1:34 PM 06/12/2021   10:53 AM 12/08/2020    9:31 AM 06/09/2020    8:41 AM  PHQ 2/9 Scores  PHQ - 2 Score 0 0 0 0 '1 1 1  '$ PHQ- 9 Score  0 0 0 1      Fall Risk    08/19/2022    1:17 PM 06/12/2022    9:25 AM  12/10/2021   11:21 AM 08/02/2021    1:31 PM 06/12/2021   10:53 AM  [place] in the past year? 0 0 0 0 0  Number falls in past yr: 0   0   Injury with Fall? 0   0   Risk for fall due to :    No Fall Risks   Follow up Falls evaluation completed   Falls prevention discussed     Sun:  Any stairs in or around the home? Yes  If so, are there any without handrails? No  Home free of loose throw rugs in walkways, pet beds, electrical cords, etc? Yes  Adequate lighting in your home to reduce risk of falls? Yes   ASSISTIVE DEVICES UTILIZED TO PREVENT FALLS:  Life alert? No  Use of a cane, walker or w/c? No  Grab bars in the bathroom? No  Shower chair or bench in shower? No  Elevated toilet seat or a handicapped toilet? No   TIMED UP AND GO:  Was the test performed? No .  Length of time to ambulate 10 feet: telephonic visit     Cognitive Function:        08/19/2022    1:24 PM 08/02/2021    1:31 PM 08/13/2018   11:49 AM  6CIT Screen  What Year? 0 points 0 points 0 points  What month? 0 points 0 points 0 points  What time? 0 points 0 points 0 points  Count back from 20 0 points 0 points 0 points  Months in reverse 0 points 0 points 0 points  Repeat phrase 0 points 0 points 0 points  Total Score 0 points 0 points 0 points    Immunizations Immunization History  Administered Date(s) Administered   Fluad Quad(high Dose 65+) 12/08/2020, 12/10/2021, 06/12/2022   Influenza, High Dose Seasonal PF 08/13/2018   Influenza,inj,Quad PF,6+ Mos 08/06/2017   Janssen (J&J)  SARS-COV-2 Vaccination 05/03/2020   Pneumococcal Conjugate-13 03/03/2015   Pneumococcal Polysaccharide-23 12/29/2008, 03/04/2016   Tdap 03/05/2018   Zoster Recombinat (Shingrix) 08/13/2018, 06/12/2021    TDAP status: Up to date  Flu Vaccine status: Up to date  Pneumococcal vaccine status: Up to date  Covid-19 vaccine status: Information provided on how to obtain  vaccines.   Qualifies for Shingles Vaccine? Yes   Zostavax completed No   Shingrix Completed?: Yes  Screening Tests Health Maintenance  Topic Date Due   COVID-19 Vaccine (2 - Janssen risk series) 12/09/2022 (Originally 05/31/2020)   Medicare Annual Wellness (AWV)  08/20/2023   Fecal DNA (Cologuard)  12/19/2024   Pneumonia Vaccine 2+ Years old  Completed   INFLUENZA VACCINE  Completed   Hepatitis C Screening  Completed   Zoster Vaccines- Shingrix  Completed   HPV VACCINES  Aged Out    Health Maintenance  There are no preventive care reminders to display for this patient.   Colorectal cancer screening: Type of screening: Cologuard. Completed 12/30/21. Repeat every 3 years  Lung Cancer Screening: (Low Dose CT Chest recommended if Age 16-80 years, 30 pack-year currently smoking OR have quit w/in 15years.) does not qualify.   Lung Cancer Screening Referral: n/a   Additional Screening:  Hepatitis C Screening: does qualify; Completed 01/31/18  Vision Screening: Recommended annual ophthalmology exams for early detection of glaucoma and other disorders of the eye. Is the patient up to date with their annual eye exam?  No  Who is the provider or what is the name of the office in which the patient attends annual eye exams? None currently If pt is not established with a provider, would they like to be referred to a provider to establish care? No .   Dental Screening: Recommended annual dental exams for proper oral hygiene  Community Resource Referral / Chronic Care Management: CRR required this visit?  No   CCM required this visit?  No      Plan:     I have personally reviewed and noted the following in the patient's chart:   Medical and social history Use of alcohol, tobacco or illicit drugs  Current medications and supplements including opioid prescriptions. Patient is not currently taking opioid prescriptions. Functional ability and status Nutritional status Physical  activity Advanced directives List of other physicians Hospitalizations, surgeries, and ER visits in previous 12 months Vitals Screenings to include cognitive, depression, and falls Referrals and appointments  In addition, I have reviewed and discussed with patient certain preventive protocols, quality metrics, and best practice recommendations. A written personalized care plan for preventive services as well as general preventive health recommendations were provided to patient.     Vanetta Mulders, [place]   08/81/1031   Due to this being a virtual visit, the after visit summary with patients personalized plan was offered to patient via mail or my-chart. Patient declined at this time  Nurse Notes: None

## 2022-09-09 DIAGNOSIS — R809 Proteinuria, unspecified: Secondary | ICD-10-CM | POA: Diagnosis not present

## 2022-09-09 DIAGNOSIS — I129 Hypertensive chronic kidney disease with stage 1 through stage 4 chronic kidney disease, or unspecified chronic kidney disease: Secondary | ICD-10-CM | POA: Diagnosis not present

## 2022-09-09 DIAGNOSIS — N1832 Chronic kidney disease, stage 3b: Secondary | ICD-10-CM | POA: Diagnosis not present

## 2022-10-08 ENCOUNTER — Other Ambulatory Visit: Payer: Self-pay | Admitting: Family Medicine

## 2022-10-08 DIAGNOSIS — F339 Major depressive disorder, recurrent, unspecified: Secondary | ICD-10-CM

## 2022-10-08 DIAGNOSIS — G4700 Insomnia, unspecified: Secondary | ICD-10-CM

## 2022-11-13 ENCOUNTER — Other Ambulatory Visit: Payer: Self-pay | Admitting: Family Medicine

## 2022-11-13 DIAGNOSIS — J4520 Mild intermittent asthma, uncomplicated: Secondary | ICD-10-CM

## 2022-12-03 ENCOUNTER — Other Ambulatory Visit: Payer: Self-pay | Admitting: Family Medicine

## 2022-12-03 DIAGNOSIS — J4520 Mild intermittent asthma, uncomplicated: Secondary | ICD-10-CM

## 2022-12-06 DIAGNOSIS — I129 Hypertensive chronic kidney disease with stage 1 through stage 4 chronic kidney disease, or unspecified chronic kidney disease: Secondary | ICD-10-CM | POA: Diagnosis not present

## 2022-12-06 DIAGNOSIS — N1832 Chronic kidney disease, stage 3b: Secondary | ICD-10-CM | POA: Diagnosis not present

## 2022-12-06 DIAGNOSIS — R809 Proteinuria, unspecified: Secondary | ICD-10-CM | POA: Diagnosis not present

## 2022-12-11 ENCOUNTER — Encounter: Payer: Self-pay | Admitting: Family Medicine

## 2022-12-11 ENCOUNTER — Ambulatory Visit (INDEPENDENT_AMBULATORY_CARE_PROVIDER_SITE_OTHER): Payer: Medicare Other | Admitting: Family Medicine

## 2022-12-11 VITALS — BP 160/76 | HR 65 | Ht 73.0 in | Wt 305.0 lb

## 2022-12-11 DIAGNOSIS — E782 Mixed hyperlipidemia: Secondary | ICD-10-CM | POA: Diagnosis not present

## 2022-12-11 DIAGNOSIS — I10 Essential (primary) hypertension: Secondary | ICD-10-CM | POA: Diagnosis not present

## 2022-12-11 DIAGNOSIS — J4520 Mild intermittent asthma, uncomplicated: Secondary | ICD-10-CM | POA: Diagnosis not present

## 2022-12-11 DIAGNOSIS — F339 Major depressive disorder, recurrent, unspecified: Secondary | ICD-10-CM

## 2022-12-11 DIAGNOSIS — G4700 Insomnia, unspecified: Secondary | ICD-10-CM | POA: Diagnosis not present

## 2022-12-11 DIAGNOSIS — I129 Hypertensive chronic kidney disease with stage 1 through stage 4 chronic kidney disease, or unspecified chronic kidney disease: Secondary | ICD-10-CM | POA: Diagnosis not present

## 2022-12-11 DIAGNOSIS — N1831 Chronic kidney disease, stage 3a: Secondary | ICD-10-CM | POA: Diagnosis not present

## 2022-12-11 LAB — CMP14+EGFR
ALT: 19 IU/L (ref 0–44)
AST: 23 IU/L (ref 0–40)
Albumin/Globulin Ratio: 1.6 (ref 1.2–2.2)
Albumin: 4.6 g/dL (ref 3.8–4.8)
Alkaline Phosphatase: 60 IU/L (ref 44–121)
BUN/Creatinine Ratio: 13 (ref 10–24)
BUN: 24 mg/dL (ref 8–27)
Bilirubin Total: 0.5 mg/dL (ref 0.0–1.2)
CO2: 22 mmol/L (ref 20–29)
Calcium: 10 mg/dL (ref 8.6–10.2)
Chloride: 100 mmol/L (ref 96–106)
Creatinine, Ser: 1.83 mg/dL — ABNORMAL HIGH (ref 0.76–1.27)
Globulin, Total: 2.9 g/dL (ref 1.5–4.5)
Glucose: 115 mg/dL — ABNORMAL HIGH (ref 70–99)
Potassium: 4.4 mmol/L (ref 3.5–5.2)
Sodium: 138 mmol/L (ref 134–144)
Total Protein: 7.5 g/dL (ref 6.0–8.5)
eGFR: 38 mL/min/{1.73_m2} — ABNORMAL LOW (ref >59)

## 2022-12-11 LAB — LIPID PANEL
Chol/HDL Ratio: 3.2 ratio (ref 0.0–5.0)
Cholesterol, Total: 152 mg/dL (ref 100–199)
HDL: 48 mg/dL (ref >39)
LDL Chol Calc (NIH): 80 mg/dL (ref 0–99)
Triglycerides: 139 mg/dL (ref 0–149)
VLDL Cholesterol Cal: 24 mg/dL (ref 5–40)

## 2022-12-11 LAB — CBC WITH DIFFERENTIAL/PLATELET
Basophils Absolute: 0.1 10*3/uL (ref 0.0–0.2)
Basos: 1 %
EOS (ABSOLUTE): 0.4 10*3/uL (ref 0.0–0.4)
Eos: 7 %
Hematocrit: 50.5 % (ref 37.5–51.0)
Hemoglobin: 16.5 g/dL (ref 13.0–17.7)
Immature Grans (Abs): 0 10*3/uL (ref 0.0–0.1)
Immature Granulocytes: 0 %
Lymphocytes Absolute: 1.8 10*3/uL (ref 0.7–3.1)
Lymphs: 32 %
MCH: 28.3 pg (ref 26.6–33.0)
MCHC: 32.7 g/dL (ref 31.5–35.7)
MCV: 87 fL (ref 79–97)
Monocytes Absolute: 0.5 10*3/uL (ref 0.1–0.9)
Monocytes: 9 %
Neutrophils Absolute: 2.8 10*3/uL (ref 1.4–7.0)
Neutrophils: 51 %
Platelets: 300 10*3/uL (ref 150–450)
RBC: 5.83 x10E6/uL — ABNORMAL HIGH (ref 4.14–5.80)
RDW: 12.4 % (ref 11.6–15.4)
WBC: 5.5 10*3/uL (ref 3.4–10.8)

## 2022-12-11 MED ORDER — ALBUTEROL SULFATE HFA 108 (90 BASE) MCG/ACT IN AERS: INHALATION_SPRAY | RESPIRATORY_TRACT | 3 refills | Status: DC

## 2022-12-11 MED ORDER — ESCITALOPRAM OXALATE 20 MG PO TABS: 20.0000 mg | ORAL_TABLET | Freq: Every day | ORAL | 3 refills | Status: DC

## 2022-12-11 MED ORDER — BUDESONIDE-FORMOTEROL FUMARATE 160-4.5 MCG/ACT IN AERO: INHALATION_SPRAY | RESPIRATORY_TRACT | 3 refills | Status: DC

## 2022-12-11 MED ORDER — FLUTICASONE PROPIONATE 50 MCG/ACT NA SUSP: NASAL | 4 refills | Status: DC

## 2022-12-11 MED ORDER — LISINOPRIL-HYDROCHLOROTHIAZIDE 20-25 MG PO TABS: ORAL_TABLET | ORAL | 3 refills | Status: DC

## 2022-12-11 MED ORDER — PRAVASTATIN SODIUM 80 MG PO TABS: 80.0000 mg | ORAL_TABLET | Freq: Every day | ORAL | 3 refills | Status: DC

## 2022-12-11 MED ORDER — FAMOTIDINE 20 MG PO TABS: 20.0000 mg | ORAL_TABLET | Freq: Two times a day (BID) | ORAL | 3 refills | Status: DC

## 2022-12-11 MED ORDER — OLOPATADINE HCL 0.1 % OP SOLN: OPHTHALMIC | 11 refills | Status: DC

## 2022-12-11 MED ORDER — CETIRIZINE HCL 10 MG PO TABS: ORAL_TABLET | ORAL | 3 refills | Status: DC

## 2022-12-11 MED ORDER — MIRTAZAPINE 15 MG PO TABS: 15.0000 mg | ORAL_TABLET | Freq: Every day | ORAL | 3 refills | Status: DC

## 2022-12-11 MED ORDER — AMLODIPINE BESYLATE 10 MG PO TABS: ORAL_TABLET | ORAL | 3 refills | Status: DC

## 2022-12-11 NOTE — Progress Notes (Signed)
BP (!) 160/76   Pulse 65   Ht '6\' 1"'$  (1.854 m)   Wt (!) 305 lb (138.3 kg)   SpO2 95%   BMI 40.24 kg/m    Subjective:   Patient ID: Martin Schultz, male    DOB: 30-Jan-1951, 72 y.o.   MRN: [id number]  HPI: Martin Schultz is a 72 y.o. male presenting on 12/11/2022 for Medical Management of Chronic Issues, Hyperlipidemia, and Hypertension   HPI Depression and insomnia Patient is coming in for recheck for depression and insomnia.  Currently takes Lexapro and mirtazapine.  He says his anxiety has been a little bit increased mainly with social situations or getting.  He says he feels okay otherwise but the biggest times bothers him.    12/11/2022    9:46 AM 08/19/2022    1:21 PM 06/12/2022    9:25 AM 12/10/2021   11:21 AM 08/02/2021    1:34 PM  Depression screen PHQ 2/9  Decreased Interest 0 0 0 0 0  Down, Depressed, Hopeless 0 0 0 0 0  PHQ - 2 Score 0 0 0 0 0  Altered sleeping 2  0 0 0  Tired, decreased energy 0  0 0 0  Change in appetite 3  0 0 0  Feeling bad or failure about yourself  0  0 0 0  Trouble concentrating 0  0 0 0  Moving slowly or fidgety/restless 0  0 0 0  Suicidal thoughts 0  0 0 0  PHQ-9 Score 5  0 0 0  Difficult doing work/chores Not difficult at all  Not difficult at all     Hypertension Patient is currently on amlodipine and lisinopril hydrochlorothiazide, and their blood pressure today is 160/76. Patient denies any lightheadedness or dizziness. Patient denies headaches, blurred vision, chest pains, shortness of breath, or weakness. Denies any side effects from medication and is content with current medication.   Hyperlipidemia Patient is coming in for recheck of his hyperlipidemia. The patient is currently taking pravastatin. They deny any issues with myalgias or history of liver damage from it. They deny any focal numbness or weakness or chest pain.   Patient is getting some seasonal allergies and wants a refill of his allergy medicines.  Relevant past  medical, surgical, family and social history reviewed and updated as indicated. Interim medical history since our last visit reviewed. Allergies and medications reviewed and updated.  Review of Systems  Constitutional:  Negative for chills and fever.  Eyes:  Negative for discharge and visual disturbance.  Respiratory:  Negative for shortness of breath and wheezing.   Cardiovascular:  Negative for chest pain and leg swelling.  Musculoskeletal:  Negative for back pain and gait problem.  Skin:  Negative for rash.  All other systems reviewed and are negative.   Per HPI unless specifically indicated above   Allergies as of 12/11/2022       Reactions   Trazodone And Nefazodone Other (See Comments)   Hallucination   Doxycycline    Kidney concerns   Benadryl [diphenhydramine] Other (See Comments)   Shakes, tremor        Medication List        Accurate as of December 11, 2022 10:17 AM. If you have any questions, ask your nurse or doctor.          albuterol 108 (90 Base) MCG/ACT inhaler Commonly known as: Ventolin HFA INHALE 2 PUFFS 4 TIMES DAILY.   amLODipine 10 MG tablet Commonly known  as: NORVASC TAKE 1 TABLET BY MOUTH ONCE DAILY.   aspirin EC 81 MG tablet Take 81 mg by mouth daily.   budesonide-formoterol 160-4.5 MCG/ACT inhaler Commonly known as: Symbicort INHALE 2 PUFFS INTO THE LUNGS EVERY MORNING AND AT BEDTIME.   cetirizine 10 MG tablet Commonly known as: ZYRTEC TAKE (1) TABLET BY MOUTH ONCE DAILY.   cholecalciferol 1000 units tablet Commonly known as: VITAMIN D Take 1,000 Units by mouth daily.   escitalopram 20 MG tablet Commonly known as: LEXAPRO Take 1 tablet (20 mg total) by mouth at bedtime.   famotidine 20 MG tablet Commonly known as: PEPCID Take 1 tablet (20 mg total) by mouth 2 (two) times daily.   fluticasone 50 MCG/ACT nasal spray Commonly known as: FLONASE INSTILL 1 SPRAY EACH NOSTRIL ONCE DAILY.   hydrALAZINE 50 MG tablet Commonly  known as: APRESOLINE Take 50 mg by mouth 2 (two) times daily.   lisinopril-hydrochlorothiazide 20-25 MG tablet Commonly known as: ZESTORETIC TAKE (1) TABLET BY MOUTH ONCE DAILY.   metroNIDAZOLE 0.75 % gel Commonly known as: METROGEL Apply 1 application topically at bedtime.   mirtazapine 15 MG tablet Commonly known as: REMERON Take 1 tablet (15 mg total) by mouth at bedtime.   olopatadine 0.1 % ophthalmic solution Commonly known as: PATANOL One drop into affected eye twice daily   pravastatin 80 MG tablet Commonly known as: PRAVACHOL Take 1 tablet (80 mg total) by mouth at bedtime.   propranolol 10 MG tablet Commonly known as: INDERAL TAKE 1 TABLET TWICE DAILY TO HELP HEADACHE AND BLOOD PRESSURE         Objective:   BP (!) 160/76   Pulse 65   Ht '6\' 1"'$  (1.854 m)   Wt (!) 305 lb (138.3 kg)   SpO2 95%   BMI 40.24 kg/m   Wt Readings from Last 3 Encounters:  12/11/22 (!) 305 lb (138.3 kg)  08/19/22 (!) 313 lb (142 kg)  06/12/22 (!) 312 lb (141.5 kg)    Physical Exam Vitals and nursing note reviewed.  Constitutional:      General: He is not in acute distress.    Appearance: He is well-developed. He is not diaphoretic.  Eyes:     General: No scleral icterus.    Conjunctiva/sclera: Conjunctivae normal.  Neck:     Thyroid: No thyromegaly.  Cardiovascular:     Rate and Rhythm: Normal rate and regular rhythm.     Heart sounds: Normal heart sounds. No murmur heard. Pulmonary:     Effort: Pulmonary effort is normal. No respiratory distress.     Breath sounds: Normal breath sounds. No wheezing.  Musculoskeletal:        General: No swelling. Normal range of motion.     Cervical back: Neck supple.  Lymphadenopathy:     Cervical: No cervical adenopathy.  Skin:    General: Skin is warm and dry.     Findings: No rash.  Neurological:     Mental Status: He is alert and oriented to person, place, and time.     Coordination: Coordination normal.  Psychiatric:         Behavior: Behavior normal.       Assessment & Plan:   Problem List Items Addressed This Visit       Cardiovascular and Mediastinum   Essential hypertension   Relevant Medications   amLODipine (NORVASC) 10 MG tablet   lisinopril-hydrochlorothiazide (ZESTORETIC) 20-25 MG tablet   pravastatin (PRAVACHOL) 80 MG tablet   Other Relevant Orders  CBC with Differential/Platelet   CMP14+EGFR     Respiratory   Asthma in adult, mild intermittent, uncomplicated   Relevant Medications   albuterol (VENTOLIN HFA) 108 (90 Base) MCG/ACT inhaler   budesonide-formoterol (SYMBICORT) 160-4.5 MCG/ACT inhaler   Other Relevant Orders   CBC with Differential/Platelet     Genitourinary   Chronic kidney disease, stage 3 unspecified (HCC) - Primary   Relevant Orders   CBC with Differential/Platelet   CMP14+EGFR   Lipid panel     Other   HLD (hyperlipidemia)   Relevant Medications   amLODipine (NORVASC) 10 MG tablet   lisinopril-hydrochlorothiazide (ZESTORETIC) 20-25 MG tablet   pravastatin (PRAVACHOL) 80 MG tablet   Other Relevant Orders   Lipid panel   Depression, recurrent (HCC)   Relevant Medications   escitalopram (LEXAPRO) 20 MG tablet   mirtazapine (REMERON) 15 MG tablet   Other Relevant Orders   CBC with Differential/Platelet   Insomnia   Relevant Medications   escitalopram (LEXAPRO) 20 MG tablet   mirtazapine (REMERON) 15 MG tablet    Renew referral to for the same that it has been for home assistance.  Will start buspirone that he can use as needed to help with anxiety.  Blood pressure elevated here in office today but likely because anxiety, runs better at home. Follow up plan: Return in about 6 months (around 06/13/2023), or if symptoms worsen or fail to improve, for recheck anxiety and depression.  Counseling provided for all of the vaccine components Orders Placed This Encounter  Procedures   CBC with Differential/Platelet   CMP14+EGFR   Lipid panel    Caryl Pina, MD [facility] 12/11/2022, 10:17 AM

## 2022-12-13 ENCOUNTER — Telehealth: Payer: Self-pay

## 2022-12-13 NOTE — Telephone Encounter (Signed)
FL2 has been completed. Left message for case worker, Anderson Malta making aware. Advised to call back with fax number to send to or she may come by the office and pick up.

## 2022-12-16 DIAGNOSIS — R809 Proteinuria, unspecified: Secondary | ICD-10-CM | POA: Diagnosis not present

## 2022-12-16 DIAGNOSIS — N281 Cyst of kidney, acquired: Secondary | ICD-10-CM | POA: Diagnosis not present

## 2022-12-16 DIAGNOSIS — I129 Hypertensive chronic kidney disease with stage 1 through stage 4 chronic kidney disease, or unspecified chronic kidney disease: Secondary | ICD-10-CM | POA: Diagnosis not present

## 2022-12-16 DIAGNOSIS — N1832 Chronic kidney disease, stage 3b: Secondary | ICD-10-CM | POA: Diagnosis not present

## 2022-12-17 ENCOUNTER — Other Ambulatory Visit (HOSPITAL_COMMUNITY): Payer: Self-pay | Admitting: Nephrology

## 2022-12-17 DIAGNOSIS — N1832 Chronic kidney disease, stage 3b: Secondary | ICD-10-CM

## 2022-12-31 ENCOUNTER — Other Ambulatory Visit: Payer: Medicare Other

## 2022-12-31 ENCOUNTER — Ambulatory Visit (HOSPITAL_COMMUNITY)
Admission: RE | Admit: 2022-12-31 | Discharge: 2022-12-31 | Disposition: A | Discharge status: Home / Self Care | Payer: Medicare Other | Source: Ambulatory Visit | Attending: Nephrology | Admitting: Nephrology

## 2022-12-31 DIAGNOSIS — N1832 Chronic kidney disease, stage 3b: Secondary | ICD-10-CM | POA: Diagnosis not present

## 2022-12-31 DIAGNOSIS — N281 Cyst of kidney, acquired: Secondary | ICD-10-CM | POA: Diagnosis not present

## 2023-01-01 ENCOUNTER — Ambulatory Visit (HOSPITAL_COMMUNITY): Payer: Medicare Other

## 2023-03-17 ENCOUNTER — Other Ambulatory Visit: Payer: Self-pay | Admitting: Family Medicine

## 2023-03-17 DIAGNOSIS — J4520 Mild intermittent asthma, uncomplicated: Secondary | ICD-10-CM

## 2023-03-20 DIAGNOSIS — Z79899 Other long term (current) drug therapy: Secondary | ICD-10-CM | POA: Diagnosis not present

## 2023-03-20 DIAGNOSIS — R809 Proteinuria, unspecified: Secondary | ICD-10-CM | POA: Diagnosis not present

## 2023-03-20 DIAGNOSIS — N189 Chronic kidney disease, unspecified: Secondary | ICD-10-CM | POA: Diagnosis not present

## 2023-03-20 DIAGNOSIS — N1832 Chronic kidney disease, stage 3b: Secondary | ICD-10-CM | POA: Diagnosis not present

## 2023-03-20 DIAGNOSIS — D638 Anemia in other chronic diseases classified elsewhere: Secondary | ICD-10-CM | POA: Diagnosis not present

## 2023-03-27 DIAGNOSIS — R809 Proteinuria, unspecified: Secondary | ICD-10-CM | POA: Diagnosis not present

## 2023-03-27 DIAGNOSIS — N1832 Chronic kidney disease, stage 3b: Secondary | ICD-10-CM | POA: Diagnosis not present

## 2023-03-27 DIAGNOSIS — E559 Vitamin D deficiency, unspecified: Secondary | ICD-10-CM | POA: Diagnosis not present

## 2023-03-27 DIAGNOSIS — I129 Hypertensive chronic kidney disease with stage 1 through stage 4 chronic kidney disease, or unspecified chronic kidney disease: Secondary | ICD-10-CM | POA: Diagnosis not present

## 2023-06-23 ENCOUNTER — Encounter: Payer: Self-pay | Admitting: Family Medicine

## 2023-06-23 ENCOUNTER — Ambulatory Visit: Payer: Medicare Other | Admitting: Family Medicine

## 2023-06-23 VITALS — BP 120/67 | HR 61 | Temp 98.7°F | Ht 73.0 in | Wt 303.0 lb

## 2023-06-23 DIAGNOSIS — I10 Essential (primary) hypertension: Secondary | ICD-10-CM

## 2023-06-23 DIAGNOSIS — Z23 Encounter for immunization: Secondary | ICD-10-CM | POA: Diagnosis not present

## 2023-06-23 DIAGNOSIS — E782 Mixed hyperlipidemia: Secondary | ICD-10-CM

## 2023-06-23 DIAGNOSIS — N1831 Chronic kidney disease, stage 3a: Secondary | ICD-10-CM | POA: Diagnosis not present

## 2023-06-23 LAB — CMP14+EGFR
ALT: 16 IU/L (ref 0–44)
AST: 19 IU/L (ref 0–40)
Albumin: 4.3 g/dL (ref 3.8–4.8)
Alkaline Phosphatase: 57 IU/L (ref 44–121)
BUN/Creatinine Ratio: 12 (ref 10–24)
BUN: 23 mg/dL (ref 8–27)
Bilirubin Total: 0.9 mg/dL (ref 0.0–1.2)
CO2: 22 mmol/L (ref 20–29)
Calcium: 9.5 mg/dL (ref 8.6–10.2)
Chloride: 101 mmol/L (ref 96–106)
Creatinine, Ser: 1.87 mg/dL — ABNORMAL HIGH (ref 0.76–1.27)
Globulin, Total: 3 g/dL (ref 1.5–4.5)
Glucose: 96 mg/dL (ref 70–99)
Potassium: 4.2 mmol/L (ref 3.5–5.2)
Sodium: 137 mmol/L (ref 134–144)
Total Protein: 7.3 g/dL (ref 6.0–8.5)
eGFR: 37 mL/min/{1.73_m2} — ABNORMAL LOW (ref >59)

## 2023-06-23 LAB — CBC WITH DIFFERENTIAL/PLATELET
Basophils Absolute: 0.1 10*3/uL (ref 0.0–0.2)
Basos: 1 %
EOS (ABSOLUTE): 0.4 10*3/uL (ref 0.0–0.4)
Eos: 7 %
Hematocrit: 49.7 % (ref 37.5–51.0)
Hemoglobin: 15.5 g/dL (ref 13.0–17.7)
Immature Grans (Abs): 0 10*3/uL (ref 0.0–0.1)
Immature Granulocytes: 0 %
Lymphocytes Absolute: 1.8 10*3/uL (ref 0.7–3.1)
Lymphs: 31 %
MCH: 27.2 pg (ref 26.6–33.0)
MCHC: 31.2 g/dL — ABNORMAL LOW (ref 31.5–35.7)
MCV: 87 fL (ref 79–97)
Monocytes Absolute: 0.5 10*3/uL (ref 0.1–0.9)
Monocytes: 9 %
Neutrophils Absolute: 3.1 10*3/uL (ref 1.4–7.0)
Neutrophils: 52 %
Platelets: 313 10*3/uL (ref 150–450)
RBC: 5.7 x10E6/uL (ref 4.14–5.80)
RDW: 12.7 % (ref 11.6–15.4)
WBC: 5.9 10*3/uL (ref 3.4–10.8)

## 2023-06-23 LAB — LIPID PANEL
Chol/HDL Ratio: 3.3 ratio (ref 0.0–5.0)
Cholesterol, Total: 132 mg/dL (ref 100–199)
HDL: 40 mg/dL (ref >39)
LDL Chol Calc (NIH): 67 mg/dL (ref 0–99)
Triglycerides: 144 mg/dL (ref 0–149)
VLDL Cholesterol Cal: 25 mg/dL (ref 5–40)

## 2023-06-23 NOTE — Progress Notes (Signed)
BP 120/67   Pulse 61   Temp 98.7 F (37.1 C)   Ht 6\' 1"  (1.854 m)   Wt (!) 303 lb (137.4 kg)   SpO2 96%   BMI 39.98 kg/m    Subjective:   Patient ID: Martin Schultz, male    DOB: [birth year], 72 y.o.   MRN: [id number]  HPI: Martin Schultz is a 72 y.o. male presenting on 06/23/2023 for Medical Management of Chronic Issues (6 month)   HPI Hypertension Patient is currently on amlodipine and lisinopril hydrochlorothiazide and hydralazine and propranolol, and their blood pressure today is 120/67. Patient denies any lightheadedness or dizziness. Patient denies headaches, blurred vision, chest pains, shortness of breath, or weakness. Denies any side effects from medication and is content with current medication.   Hyperlipidemia Patient is coming in for recheck of his hyperlipidemia. The patient is currently taking pravastatin. They deny any issues with myalgias or history of liver damage from it. They deny any focal numbness or weakness or chest pain.   CKD 3 recheck Patient is coming in today for CKD 3 recheck.  Will do blood work today.  It does look like he had a renal ultrasound done this past year.  The ultrasound showed chronic medical disease and some renal cysts but otherwise normal.  He does follow-up with Dr. Wolfgang Phoenix as his nephrologist.  Patient has been fighting some left knee arthritis especially on the medial aspect of his knee that pops and hurts and sometimes bothers him a bit.  He has been having this off and on over the past 6 months at least.  He has not tried anything over-the-counter for.  He did not know what to take.  Symptoms negative better but not getting worse either.  Relevant past medical, surgical, family and social history reviewed and updated as indicated. Interim medical history since our last visit reviewed. Allergies and medications reviewed and updated.  Review of Systems  Constitutional:  Negative for chills and fever.  Respiratory:  Negative for  shortness of breath and wheezing.   Cardiovascular:  Negative for chest pain and leg swelling.  Musculoskeletal:  Positive for arthralgias. Negative for back pain, gait problem and joint swelling.  Skin:  Negative for rash.  All other systems reviewed and are negative.   Per HPI unless specifically indicated above   Allergies as of 06/23/2023       Reactions   Trazodone And Nefazodone Other (See Comments)   Hallucination   Doxycycline    Kidney concerns   Benadryl [diphenhydramine] Other (See Comments)   Shakes, tremor        Medication List        Accurate as of June 23, 2023  9:43 AM. If you have any questions, ask your nurse or doctor.          albuterol 108 (90 Base) MCG/ACT inhaler Commonly known as: Ventolin HFA INHALE 2 PUFFS 4 TIMES DAILY.   amLODipine 10 MG tablet Commonly known as: NORVASC TAKE 1 TABLET BY MOUTH ONCE DAILY.   aspirin EC 81 MG tablet Take 81 mg by mouth daily.   budesonide-formoterol 160-4.5 MCG/ACT inhaler Commonly known as: Symbicort INHALE 2 PUFFS INTO THE LUNGS EVERY MORNING AND AT BEDTIME.   cetirizine 10 MG tablet Commonly known as: ZYRTEC TAKE (1) TABLET BY MOUTH ONCE DAILY.   cholecalciferol 1000 units tablet Commonly known as: VITAMIN D Take 1,000 Units by mouth daily.   escitalopram 20 MG tablet Commonly known as: LEXAPRO  Take 1 tablet (20 mg total) by mouth at bedtime.   famotidine 20 MG tablet Commonly known as: PEPCID Take 1 tablet (20 mg total) by mouth 2 (two) times daily.   fluticasone 50 MCG/ACT nasal spray Commonly known as: FLONASE INSTILL 1 SPRAY EACH NOSTRIL ONCE DAILY.   hydrALAZINE 50 MG tablet Commonly known as: APRESOLINE Take 50 mg by mouth 2 (two) times daily.   lisinopril-hydrochlorothiazide 20-25 MG tablet Commonly known as: ZESTORETIC TAKE (1) TABLET BY MOUTH ONCE DAILY.   metroNIDAZOLE 0.75 % gel Commonly known as: METROGEL Apply 1 application topically at bedtime.    mirtazapine 15 MG tablet Commonly known as: REMERON Take 1 tablet (15 mg total) by mouth at bedtime.   olopatadine 0.1 % ophthalmic solution Commonly known as: PATANOL One drop into affected eye twice daily   pravastatin 80 MG tablet Commonly known as: PRAVACHOL Take 1 tablet (80 mg total) by mouth at bedtime.   propranolol 10 MG tablet Commonly known as: INDERAL TAKE 1 TABLET TWICE DAILY TO HELP HEADACHE AND BLOOD PRESSURE         Objective:   BP 120/67   Pulse 61   Temp 98.7 F (37.1 C)   Ht 6\' 1"  (1.854 m)   Wt (!) 303 lb (137.4 kg)   SpO2 96%   BMI 39.98 kg/m   Wt Readings from Last 3 Encounters:  06/23/23 (!) 303 lb (137.4 kg)  12/11/22 (!) 305 lb (138.3 kg)  08/19/22 (!) 313 lb (142 kg)    Physical Exam Vitals and nursing note reviewed.  Constitutional:      General: He is not in acute distress.    Appearance: He is well-developed. He is not diaphoretic.  Eyes:     General: No scleral icterus.    Conjunctiva/sclera: Conjunctivae normal.  Neck:     Thyroid: No thyromegaly.  Cardiovascular:     Rate and Rhythm: Normal rate and regular rhythm.     Heart sounds: Normal heart sounds. No murmur heard. Pulmonary:     Effort: Pulmonary effort is normal. No respiratory distress.     Breath sounds: Normal breath sounds. No wheezing.  Musculoskeletal:        General: Normal range of motion.     Cervical back: Neck supple.     Left knee: Crepitus present. No bony tenderness. Tenderness present over the medial joint line. Normal meniscus and normal patellar mobility.  Lymphadenopathy:     Cervical: No cervical adenopathy.  Skin:    General: Skin is warm and dry.     Findings: No rash.  Neurological:     Mental Status: He is alert and oriented to person, place, and time.     Coordination: Coordination normal.  Psychiatric:        Behavior: Behavior normal.       Assessment & Plan:   Problem List Items Addressed This Visit       Cardiovascular and  Mediastinum   Essential hypertension - Primary   Relevant Orders   CBC with Differential/Platelet   CMP14+EGFR   Lipid panel     Genitourinary   Chronic kidney disease, stage 3 unspecified (HCC)   Relevant Orders   CBC with Differential/Platelet   CMP14+EGFR   Lipid panel     Other   HLD (hyperlipidemia)   Relevant Orders   CBC with Differential/Platelet   CMP14+EGFR   Lipid panel    Continue current medicine, seems to be doing well.  Recommended Voltaren gel to  help with his knee and there if that does not work for him in the future we could try an intra-articular injection. Follow up plan: Return in about 6 months (around 12/21/2023), or if symptoms worsen or fail to improve, for Hypertension and CKD and hyperlipidemia recheck.  Counseling provided for all of the vaccine components Orders Placed This Encounter  Procedures   CBC with Differential/Platelet   CMP14+EGFR   Lipid panel    Arville Care, MD [facility] 06/23/2023, 9:43 AM

## 2023-07-08 DIAGNOSIS — E559 Vitamin D deficiency, unspecified: Secondary | ICD-10-CM | POA: Diagnosis not present

## 2023-07-08 DIAGNOSIS — I129 Hypertensive chronic kidney disease with stage 1 through stage 4 chronic kidney disease, or unspecified chronic kidney disease: Secondary | ICD-10-CM | POA: Diagnosis not present

## 2023-07-08 DIAGNOSIS — N189 Chronic kidney disease, unspecified: Secondary | ICD-10-CM | POA: Diagnosis not present

## 2023-07-08 DIAGNOSIS — N1832 Chronic kidney disease, stage 3b: Secondary | ICD-10-CM | POA: Diagnosis not present

## 2023-07-08 DIAGNOSIS — R809 Proteinuria, unspecified: Secondary | ICD-10-CM | POA: Diagnosis not present

## 2023-07-29 DIAGNOSIS — I129 Hypertensive chronic kidney disease with stage 1 through stage 4 chronic kidney disease, or unspecified chronic kidney disease: Secondary | ICD-10-CM | POA: Diagnosis not present

## 2023-07-29 DIAGNOSIS — N1831 Chronic kidney disease, stage 3a: Secondary | ICD-10-CM | POA: Diagnosis not present

## 2023-07-29 DIAGNOSIS — E559 Vitamin D deficiency, unspecified: Secondary | ICD-10-CM | POA: Diagnosis not present

## 2023-07-29 DIAGNOSIS — R809 Proteinuria, unspecified: Secondary | ICD-10-CM | POA: Diagnosis not present

## 2023-09-19 ENCOUNTER — Ambulatory Visit: Payer: Medicare Other

## 2023-09-19 VITALS — Ht 73.0 in | Wt 303.0 lb

## 2023-09-19 DIAGNOSIS — Z0000 Encounter for general adult medical examination without abnormal findings: Secondary | ICD-10-CM

## 2023-09-19 NOTE — Progress Notes (Signed)
Subjective:   Martin Schultz is a 72 y.o. male who presents for Medicare Annual/Subsequent preventive examination.  Visit Complete: Virtual I connected with  Martin Schultz on 09/19/23 by a audio enabled telemedicine application and verified that I am speaking with the correct person using two identifiers.  Patient Location: Home  Provider Location: Home Office  I discussed the limitations of evaluation and management by telemedicine. The patient expressed understanding and agreed to proceed.  Vital Signs: Because this visit was a virtual/telehealth visit, some criteria may be missing or patient reported. Any vitals not documented were not able to be obtained and vitals that have been documented are patient reported.  Cardiac Risk Factors include: advanced age (>54men, >71 women);dyslipidemia;hypertension;male gender     Objective:    Today's Vitals   09/19/23 1646  Weight: (!) 303 lb (137.4 kg)  Height: 6\' 1"  (1.854 m)   Body mass index is 39.98 kg/m.     09/19/2023    4:49 PM 08/19/2022    1:22 PM 08/02/2021    1:36 PM 08/13/2018   11:33 AM  Advanced Directives  Does Patient Have a Medical Advance Directive? No No No No  Would patient like information on creating a medical advance directive? Yes (MAU/Ambulatory/Procedural Areas - Information given) No - Patient declined No - Patient declined No - Patient declined    Current Medications (verified) Outpatient Encounter Medications as of 09/19/2023  Medication Sig   albuterol (VENTOLIN HFA) 108 (90 Base) MCG/ACT inhaler INHALE 2 PUFFS 4 TIMES DAILY.   amLODipine (NORVASC) 10 MG tablet TAKE 1 TABLET BY MOUTH ONCE DAILY.   aspirin EC 81 MG tablet Take 81 mg by mouth daily.   budesonide-formoterol (SYMBICORT) 160-4.5 MCG/ACT inhaler INHALE 2 PUFFS INTO THE LUNGS EVERY MORNING AND AT BEDTIME.   cetirizine (ZYRTEC) 10 MG tablet TAKE (1) TABLET BY MOUTH ONCE DAILY.   cholecalciferol (VITAMIN D) 1000 units tablet Take 1,000  Units by mouth daily.   escitalopram (LEXAPRO) 20 MG tablet Take 1 tablet (20 mg total) by mouth at bedtime.   famotidine (PEPCID) 20 MG tablet Take 1 tablet (20 mg total) by mouth 2 (two) times daily.   fluticasone (FLONASE) 50 MCG/ACT nasal spray INSTILL 1 SPRAY EACH NOSTRIL ONCE DAILY.   hydrALAZINE (APRESOLINE) 50 MG tablet Take 50 mg by mouth 2 (two) times daily.   lisinopril-hydrochlorothiazide (ZESTORETIC) 20-25 MG tablet TAKE (1) TABLET BY MOUTH ONCE DAILY.   metroNIDAZOLE (METROGEL) 0.75 % gel Apply 1 application topically at bedtime.   mirtazapine (REMERON) 15 MG tablet Take 1 tablet (15 mg total) by mouth at bedtime.   olopatadine (PATANOL) 0.1 % ophthalmic solution One drop into affected eye twice daily   pravastatin (PRAVACHOL) 80 MG tablet Take 1 tablet (80 mg total) by mouth at bedtime.   propranolol (INDERAL) 10 MG tablet TAKE 1 TABLET TWICE DAILY TO HELP HEADACHE AND BLOOD PRESSURE   No facility-administered encounter medications on file as of 09/19/2023.    Allergies (verified) Trazodone and nefazodone, Doxycycline, and Benadryl [diphenhydramine]   History: Past Medical History:  Diagnosis Date   Anxiety    Arthritis    left shoulder and knee   Asthma in adult, mild intermittent, uncomplicated 05/16/2017   Cancer (HCC)    skin   Chronic kidney disease    renal cyst and renal disease   Depression    GERD (gastroesophageal reflux disease)    Hyperlipidemia    Hypertension    Past Surgical History:  Procedure Laterality Date   APPENDECTOMY     FINGER AMPUTATION     HERNIA REPAIR     Family History  Problem Relation Age of Onset   Pneumonia Mother    Heart disease Father    Cancer Father    Heart attack Father    COPD Sister    Cancer Brother        small intestine and lung   Pneumonia Brother    Cancer Brother    Social History   Socioeconomic History   Marital status: Single    Spouse name: Not on file   Number of children: 0   Years of  education: 6   Highest education level: 6th grade  Occupational History   Occupation: retired  Tobacco Use   Smoking status: Never   Smokeless tobacco: Never  Vaping Use   Vaping status: Never Used  Substance and Sexual Activity   Alcohol use: Not Currently    Comment: quit 20 years ago   Drug use: No   Sexual activity: Not Currently  Other Topics Concern   Not on file  Social History Narrative   Lives at home alone. He has two neighbors that he is close with and his niece checks in on him often. He also has a Child psychotherapist that helps with transportation. "[place]reg[place] in the [place] store"   Social Drivers of Health   Financial Resource Strain: Low Risk  (09/19/2023)   Overall Financial Resource Strain (CARDIA)    Difficulty of Paying Living Expenses: Not hard at all  Food Insecurity: No Food Insecurity (09/19/2023)   Hunger Vital Sign    Worried About Running Out of Food in the Last Year: Never true    Ran Out of Food in the Last Year: Never true  Transportation Needs: No Transportation Needs (09/19/2023)   PRAPARE - Administrator, Civil Service (Medical): No    Lack of Transportation (Non-Medical): No  Physical Activity: Sufficiently Active (09/19/2023)   Exercise Vital Sign    Days of Exercise per Week: 7 days    Minutes of Exercise per Session: 30 min  Stress: No Stress Concern Present (09/19/2023)   [employer] of Occupational Health - Occupational Stress Questionnaire    Feeling of Stress : Not at all  Social Connections: Moderately Isolated (09/19/2023)   Social Connection and Isolation Panel [NHANES]    Frequency of Communication with Friends and Family: More than three times a week    Frequency of Social Gatherings with Friends and Family: Three times a week    Attends Religious Services: Never    Active Member of Clubs or Organizations: Yes    Attends Banker Meetings: 1 to 4 times per year    Marital Status: Never married     Tobacco Counseling Counseling given: Not Answered   Clinical Intake:  Pre-visit preparation completed: Yes  Pain : No/denies pain     Diabetes: No  How often do you need to have someone help you when you read instructions, pamphlets, or other written materials from your doctor or pharmacy?: 3 - Sometimes  Interpreter Needed?: No  Information entered by :: Kandis Fantasia LPN   Activities of Daily Living    09/19/2023    4:47 PM  In your present state of health, do you have any difficulty performing the following activities:  Hearing? 0  Vision? 0  Difficulty concentrating or making decisions? 0  Walking or climbing stairs? 0  Dressing or  bathing? 0  Doing errands, shopping? 0  Preparing Food and eating ? N  Using the Toilet? N  In the past six months, have you accidently leaked urine? N  Do you have problems with loss of bowel control? N  Managing your Medications? N  Managing your Finances? N  Housekeeping or managing your Housekeeping? N    Patient Care Team: Dettinger, Elige Radon, MD as PCP - General (Family Medicine) Randa Lynn, MD as Consulting Physician (Nephrology) Nita Sells, MD (Dermatology)  Indicate any recent Medical Services you may have received from other than [facility] providers in the past year (date may be approximate).     Assessment:   This is a routine wellness examination for Martin Schultz.  Hearing/Vision screen Hearing Screening - Comments:: Denies hearing difficulties   Vision Screening - Comments:: No vision problems; will schedule routine eye exam soon     Goals Addressed   None   Depression Screen    09/19/2023    4:48 PM 06/23/2023    9:15 AM 12/11/2022    9:46 AM 08/19/2022    1:21 PM 06/12/2022    9:25 AM 12/10/2021   11:21 AM 08/02/2021    1:34 PM  PHQ 2/9 Scores  PHQ - 2 Score 0 0 0 0 0 0 0  PHQ- 9 Score  0 5  0 0 0    Fall Risk    09/19/2023    4:49 PM 06/23/2023    9:09 AM 12/11/2022    9:46 AM 08/19/2022     1:17 PM 06/12/2022    9:25 AM  Fall Risk   Falls in the past year? 0 0 0 0 0  Number falls in past yr: 0 0 0 0   Injury with Fall? 0 0 0 0   Risk for fall due to : No Fall Risks No Fall Risks     Follow up Falls prevention discussed;Education provided;Falls evaluation completed Education provided  Falls evaluation completed     MEDICARE RISK AT HOME: Medicare Risk at Home Any stairs in or around the home?: No If so, are there any without handrails?: No Home free of loose throw rugs in walkways, pet beds, electrical cords, etc?: Yes Adequate lighting in your home to reduce risk of falls?: Yes Life alert?: No Use of a cane, walker or w/c?: No Grab bars in the bathroom?: Yes Shower chair or bench in shower?: No Elevated toilet seat or a handicapped toilet?: Yes  TIMED UP AND GO:  Was the test performed?  No    Cognitive Function:        09/19/2023    4:49 PM 08/19/2022    1:24 PM 08/02/2021    1:31 PM 08/13/2018   11:49 AM  6CIT Screen  What Year? 0 points 0 points 0 points 0 points  What month? 0 points 0 points 0 points 0 points  What time? 0 points 0 points 0 points 0 points  Count back from 20 0 points 0 points 0 points 0 points  Months in reverse 2 points 0 points 0 points 0 points  Repeat phrase 0 points 0 points 0 points 0 points  Total Score 2 points 0 points 0 points 0 points    Immunizations Immunization History  Administered Date(s) Administered   Fluad Quad(high Dose 65+) 12/08/2020, 12/10/2021, 06/12/2022   Fluad Trivalent(High Dose 65+) 06/23/2023   Influenza, High Dose Seasonal PF 08/13/2018   Influenza,inj,Quad PF,6+ Mos 08/06/2017   Janssen (J&J) SARS-COV-2 Vaccination  05/03/2020   Pneumococcal Conjugate-13 03/03/2015   Pneumococcal Polysaccharide-23 12/29/2008, 03/04/2016   Tdap 03/05/2018   Zoster Recombinant(Shingrix) 08/13/2018, 06/12/2021    TDAP status: Up to date  Flu Vaccine status: Up to date  Pneumococcal vaccine status: Up to  date  Covid-19 vaccine status: Information provided on how to obtain vaccines.   Qualifies for Shingles Vaccine? Yes   Zostavax completed No   Shingrix Completed?: Yes  Screening Tests Health Maintenance  Topic Date Due   COVID-19 Vaccine (2 - Janssen risk series) 05/31/2020   Medicare Annual Wellness (AWV)  09/18/2024   Fecal DNA (Cologuard)  12/19/2024   DTaP/Tdap/Td (2 - Td or Tdap) 03/05/2028   Pneumonia Vaccine 61+ Years old  Completed   INFLUENZA VACCINE  Completed   Hepatitis C Screening  Completed   Zoster Vaccines- Shingrix  Completed   HPV VACCINES  Aged Out    Health Maintenance  Health Maintenance Due  Topic Date Due   COVID-19 Vaccine (2 - Janssen risk series) 05/31/2020    Colorectal cancer screening: Type of screening: Cologuard. Completed 12/19/21. Repeat every 3 years  Lung Cancer Screening: (Low Dose CT Chest recommended if Age 11-80 years, 20 pack-year currently smoking OR have quit w/in 15years.) does not qualify.   Lung Cancer Screening Referral: n/a  Additional Screening:  Hepatitis C Screening: does qualify; Completed 01/30/18  Vision Screening: Recommended annual ophthalmology exams for early detection of glaucoma and other disorders of the eye. Is the patient up to date with their annual eye exam?  No  Who is the provider or what is the name of the office in which the patient attends annual eye exams? none If pt is not established with a provider, would they like to be referred to a provider to establish care? No .   Dental Screening: Recommended annual dental exams for proper oral hygiene  Community Resource Referral / Chronic Care Management: CRR required this visit?  No   CCM required this visit?  No     Plan:     I have personally reviewed and noted the following in the patient's chart:   Medical and social history Use of alcohol, tobacco or illicit drugs  Current medications and supplements including opioid prescriptions. Patient  is not currently taking opioid prescriptions. Functional ability and status Nutritional status Physical activity Advanced directives List of other physicians Hospitalizations, surgeries, and ER visits in previous 12 months Vitals Screenings to include cognitive, depression, and falls Referrals and appointments  In addition, I have reviewed and discussed with patient certain preventive protocols, quality metrics, and best practice recommendations. A written personalized care plan for preventive services as well as general preventive health recommendations were provided to patient.     Kandis Fantasia [place], [place]   16/07/[id number]   After Visit Summary: (MyChart) Due to this being a telephonic visit, the after visit summary with patients personalized plan was offered to patient via MyChart   Nurse Notes: No concerns at this time

## 2023-09-19 NOTE — Patient Instructions (Signed)
Mr. Sepe , Thank you for taking time to come for your Medicare Wellness Visit. I appreciate your ongoing commitment to your health goals. Please review the following plan we discussed and let me know if I can assist you in the future.   Referrals/Orders/Follow-Ups/Clinician Recommendations: Aim for 30 minutes of exercise or brisk walking, 6-8 glasses of water, and 5 servings of fruits and vegetables each day.  This is a list of the screening recommended for you and due dates:  Health Maintenance  Topic Date Due   COVID-19 Vaccine (2 - Janssen risk series) 05/31/2020   Medicare Annual Wellness Visit  09/18/2024   Cologuard (Stool DNA test)  12/19/2024   DTaP/Tdap/Td vaccine (2 - Td or Tdap) 03/05/2028   Pneumonia Vaccine  Completed   Flu Shot  Completed   Hepatitis C Screening  Completed   Zoster (Shingles) Vaccine  Completed   HPV Vaccine  Aged Out    Advanced directives: (ACP Link)Information on Advanced Care Planning can be found at [facility] Advance Health Care Directives Advance Health Care Directives ([email or web address]   Next Medicare Annual Wellness Visit scheduled for next year: Yes

## 2023-09-29 ENCOUNTER — Other Ambulatory Visit: Payer: Self-pay | Admitting: Family Medicine

## 2023-09-29 DIAGNOSIS — J4520 Mild intermittent asthma, uncomplicated: Secondary | ICD-10-CM

## 2023-10-27 DIAGNOSIS — E559 Vitamin D deficiency, unspecified: Secondary | ICD-10-CM | POA: Diagnosis not present

## 2023-10-27 DIAGNOSIS — R809 Proteinuria, unspecified: Secondary | ICD-10-CM | POA: Diagnosis not present

## 2023-10-27 DIAGNOSIS — N1831 Chronic kidney disease, stage 3a: Secondary | ICD-10-CM | POA: Diagnosis not present

## 2023-10-27 DIAGNOSIS — I129 Hypertensive chronic kidney disease with stage 1 through stage 4 chronic kidney disease, or unspecified chronic kidney disease: Secondary | ICD-10-CM | POA: Diagnosis not present

## 2023-11-05 DIAGNOSIS — E559 Vitamin D deficiency, unspecified: Secondary | ICD-10-CM | POA: Diagnosis not present

## 2023-11-05 DIAGNOSIS — R809 Proteinuria, unspecified: Secondary | ICD-10-CM | POA: Diagnosis not present

## 2023-11-05 DIAGNOSIS — N1832 Chronic kidney disease, stage 3b: Secondary | ICD-10-CM | POA: Diagnosis not present

## 2023-11-05 DIAGNOSIS — I129 Hypertensive chronic kidney disease with stage 1 through stage 4 chronic kidney disease, or unspecified chronic kidney disease: Secondary | ICD-10-CM | POA: Diagnosis not present

## 2023-11-18 ENCOUNTER — Other Ambulatory Visit: Payer: Self-pay | Admitting: Family Medicine

## 2023-11-18 DIAGNOSIS — F339 Major depressive disorder, recurrent, unspecified: Secondary | ICD-10-CM

## 2023-11-18 DIAGNOSIS — G4700 Insomnia, unspecified: Secondary | ICD-10-CM

## 2023-12-24 ENCOUNTER — Ambulatory Visit: Payer: Medicare Other | Admitting: Family Medicine

## 2023-12-25 ENCOUNTER — Ambulatory Visit: Payer: Medicare Other | Admitting: Family Medicine

## 2023-12-29 ENCOUNTER — Other Ambulatory Visit: Payer: Self-pay | Admitting: Family Medicine

## 2024-01-23 ENCOUNTER — Other Ambulatory Visit: Payer: Self-pay | Admitting: Family Medicine

## 2024-01-23 DIAGNOSIS — F339 Major depressive disorder, recurrent, unspecified: Secondary | ICD-10-CM

## 2024-01-23 DIAGNOSIS — G4700 Insomnia, unspecified: Secondary | ICD-10-CM

## 2024-01-27 ENCOUNTER — Other Ambulatory Visit: Payer: Self-pay | Admitting: Family Medicine

## 2024-01-27 DIAGNOSIS — F339 Major depressive disorder, recurrent, unspecified: Secondary | ICD-10-CM

## 2024-01-27 DIAGNOSIS — G4700 Insomnia, unspecified: Secondary | ICD-10-CM

## 2024-02-05 ENCOUNTER — Encounter: Payer: Self-pay | Admitting: Family Medicine

## 2024-02-05 ENCOUNTER — Ambulatory Visit: Admitting: Family Medicine

## 2024-02-05 ENCOUNTER — Other Ambulatory Visit: Payer: Self-pay | Admitting: Family Medicine

## 2024-02-05 VITALS — BP 136/82 | HR 61 | Ht 73.0 in | Wt 305.0 lb

## 2024-02-05 DIAGNOSIS — L719 Rosacea, unspecified: Secondary | ICD-10-CM

## 2024-02-05 DIAGNOSIS — Z125 Encounter for screening for malignant neoplasm of prostate: Secondary | ICD-10-CM | POA: Diagnosis not present

## 2024-02-05 DIAGNOSIS — I10 Essential (primary) hypertension: Secondary | ICD-10-CM | POA: Diagnosis not present

## 2024-02-05 DIAGNOSIS — G4700 Insomnia, unspecified: Secondary | ICD-10-CM

## 2024-02-05 DIAGNOSIS — N1832 Chronic kidney disease, stage 3b: Secondary | ICD-10-CM | POA: Diagnosis not present

## 2024-02-05 DIAGNOSIS — E782 Mixed hyperlipidemia: Secondary | ICD-10-CM | POA: Diagnosis not present

## 2024-02-05 DIAGNOSIS — F339 Major depressive disorder, recurrent, unspecified: Secondary | ICD-10-CM | POA: Diagnosis not present

## 2024-02-05 DIAGNOSIS — J4520 Mild intermittent asthma, uncomplicated: Secondary | ICD-10-CM

## 2024-02-05 LAB — LIPID PANEL

## 2024-02-05 MED ORDER — MIRTAZAPINE 15 MG PO TABS: 15.0000 mg | ORAL_TABLET | Freq: Every day | ORAL | 3 refills | Status: AC

## 2024-02-05 MED ORDER — FLUTICASONE PROPIONATE 50 MCG/ACT NA SUSP: NASAL | 5 refills | Status: DC

## 2024-02-05 MED ORDER — LISINOPRIL-HYDROCHLOROTHIAZIDE 20-25 MG PO TABS: 1.0000 | ORAL_TABLET | Freq: Every day | ORAL | 3 refills | Status: AC

## 2024-02-05 MED ORDER — FAMOTIDINE 20 MG PO TABS: 20.0000 mg | ORAL_TABLET | Freq: Two times a day (BID) | ORAL | 3 refills | Status: AC

## 2024-02-05 MED ORDER — ALBUTEROL SULFATE HFA 108 (90 BASE) MCG/ACT IN AERS
INHALATION_SPRAY | RESPIRATORY_TRACT | 1 refills | Status: DC
Start: 2024-02-05 — End: 2024-08-09

## 2024-02-05 MED ORDER — BUDESONIDE-FORMOTEROL FUMARATE 160-4.5 MCG/ACT IN AERO: INHALATION_SPRAY | RESPIRATORY_TRACT | 1 refills | Status: DC

## 2024-02-05 MED ORDER — OLOPATADINE HCL 0.1 % OP SOLN: OPHTHALMIC | 11 refills | Status: DC

## 2024-02-05 MED ORDER — CETIRIZINE HCL 10 MG PO TABS: 10.0000 mg | ORAL_TABLET | Freq: Every day | ORAL | 3 refills | Status: DC

## 2024-02-05 MED ORDER — AMLODIPINE BESYLATE 10 MG PO TABS: 10.0000 mg | ORAL_TABLET | Freq: Every day | ORAL | 3 refills | Status: AC

## 2024-02-05 MED ORDER — PROPRANOLOL HCL 10 MG PO TABS: ORAL_TABLET | ORAL | 3 refills | Status: AC

## 2024-02-05 MED ORDER — PRAVASTATIN SODIUM 80 MG PO TABS: 80.0000 mg | ORAL_TABLET | Freq: Every day | ORAL | 3 refills | Status: AC

## 2024-02-05 MED ORDER — ESCITALOPRAM OXALATE 20 MG PO TABS: 20.0000 mg | ORAL_TABLET | Freq: Every day | ORAL | 3 refills | Status: AC

## 2024-02-05 MED ORDER — METRONIDAZOLE 0.75 % EX GEL
1.0000 | Freq: Every day | CUTANEOUS | 1 refills | Status: DC
Start: 2024-02-05 — End: 2024-03-23

## 2024-02-05 NOTE — Progress Notes (Signed)
 BP 136/82   Pulse 61   Ht 6\' 1"  (1.854 m)   Wt (!) 305 lb (138.3 kg)   SpO2 95%   BMI 40.24 kg/m    Subjective:   Patient ID: Martin Schultz, male    DOB: 07/13/1951, 73 y.o.   MRN: [id number]  HPI: Martin Schultz is a 73 y.o. male presenting on 02/05/2024 for Medical Management of Chronic Issues, Chronic Kidney Disease, Hypertension, and Hyperlipidemia   HPI Hypertension and CKD 3 recheck Patient is currently on amlodipine  and Farxiga and lisinopril -hydrochlorothiazide  and propranolol , and their blood pressure today is 136/82. Patient denies any lightheadedness or dizziness. Patient denies headaches, blurred vision, chest pains, shortness of breath, or weakness. Denies any side effects from medication and is content with current medication.   Hyperlipidemia Patient is coming in for recheck of his hyperlipidemia. The patient is currently taking pravastatin . They deny any issues with myalgias or history of liver damage from it. They deny any focal numbness or weakness or chest pain.   Depression and insomnia Patient feels like he is doing well on Lexapro  and mirtazapine .     02/05/2024    2:59 PM 09/19/2023    4:48 PM 06/23/2023    9:15 AM 12/11/2022    9:46 AM 08/19/2022    1:21 PM  Depression screen PHQ 2/9  Decreased Interest 0 0 0 0 0  Down, Depressed, Hopeless 0 0 0 0 0  PHQ - 2 Score 0 0 0 0 0  Altered sleeping 0  0 2   Tired, decreased energy 0  0 0   Change in appetite 0  0 3   Feeling bad or failure about yourself  0  0 0   Trouble concentrating 0  0 0   Moving slowly or fidgety/restless 0  0 0   Suicidal thoughts 0  0 0   PHQ-9 Score 0  0 5   Difficult doing work/chores Not difficult at all  Not difficult at all Not difficult at all      Asthma and allergy recheck Patient uses Symbicort  and albuterol  and takes Zyrtec  for asthma and allergies, also uses Flonase .  Flonase  and Symbicort  and Ventolin , having a little bit of allergies this time a year but not doing too  bad.  He feels like the inhalers keep him pretty well.  Patient has been diagnosed with rosacea from his dermatologist and takes metronidazole gel he wants a refill for this.  Relevant past medical, surgical, family and social history reviewed and updated as indicated. Interim medical history since our last visit reviewed. Allergies and medications reviewed and updated.  Review of Systems  Constitutional:  Negative for chills and fever.  Eyes:  Negative for visual disturbance.  Respiratory:  Negative for shortness of breath and wheezing.   Cardiovascular:  Positive for leg swelling. Negative for chest pain.  Musculoskeletal:  Negative for back pain and gait problem.  Skin:  Negative for rash.  Neurological:  Negative for dizziness and light-headedness.  All other systems reviewed and are negative.   Per HPI unless specifically indicated above   Allergies as of 02/05/2024       Reactions   Trazodone And Nefazodone Other (See Comments)   Hallucination   Doxycycline    Kidney concerns   Benadryl [diphenhydramine] Other (See Comments)   Shakes, tremor        Medication List        Accurate as of Feb 05, 2024  3:19  PM. If you have any questions, ask your nurse or doctor.          albuterol  108 (90 Base) MCG/ACT inhaler Commonly known as: Ventolin  HFA INHALE 2 PUFFS 4 TIMES DAILY.   amLODipine  10 MG tablet Commonly known as: NORVASC  Take 1 tablet (10 mg total) by mouth daily.   aspirin EC 81 MG tablet Take 81 mg by mouth daily.   budesonide -formoterol  160-4.5 MCG/ACT inhaler Commonly known as: Symbicort  INHALE 2 PUFFS INTO THE LUNGS EVERY MORNING AND AT BEDTIME.   cetirizine  10 MG tablet Commonly known as: ZYRTEC  Take 1 tablet (10 mg total) by mouth daily.   cholecalciferol 1000 units tablet Commonly known as: VITAMIN D  Take 1,000 Units by mouth daily.   escitalopram  20 MG tablet Commonly known as: LEXAPRO  Take 1 tablet (20 mg total) by mouth at bedtime.    famotidine  20 MG tablet Commonly known as: PEPCID  Take 1 tablet (20 mg total) by mouth 2 (two) times daily.   fluticasone  50 MCG/ACT nasal spray Commonly known as: FLONASE  INSTILL 1 SPRAY EACH NOSTRIL ONCE DAILY.   hydrALAZINE 50 MG tablet Commonly known as: APRESOLINE Take 50 mg by mouth 2 (two) times daily.   lisinopril -hydrochlorothiazide  20-25 MG tablet Commonly known as: ZESTORETIC  Take 1 tablet by mouth daily.   metroNIDAZOLE 0.75 % gel Commonly known as: METROGEL Apply 1 Application topically at bedtime.   mirtazapine  15 MG tablet Commonly known as: REMERON  Take 1 tablet (15 mg total) by mouth at bedtime.   olopatadine  0.1 % ophthalmic solution Commonly known as: PATANOL One drop into affected eye twice daily   pravastatin  80 MG tablet Commonly known as: PRAVACHOL  Take 1 tablet (80 mg total) by mouth at bedtime.   propranolol  10 MG tablet Commonly known as: INDERAL  TAKE 1 TABLET TWICE DAILY TO HELP HEADACHE AND BLOOD PRESSURE         Objective:   BP 136/82   Pulse 61   Ht 6\' 1"  (1.854 m)   Wt (!) 305 lb (138.3 kg)   SpO2 95%   BMI 40.24 kg/m   Wt Readings from Last 3 Encounters:  02/05/24 (!) 305 lb (138.3 kg)  09/19/23 (!) 303 lb (137.4 kg)  06/23/23 (!) 303 lb (137.4 kg)    Physical Exam Vitals and nursing note reviewed.  Constitutional:      General: He is not in acute distress.    Appearance: He is well-developed. He is not diaphoretic.  Eyes:     General: No scleral icterus.    Conjunctiva/sclera: Conjunctivae normal.  Neck:     Thyroid: No thyromegaly.  Cardiovascular:     Rate and Rhythm: Normal rate and regular rhythm.     Heart sounds: Normal heart sounds. No murmur heard. Pulmonary:     Effort: Pulmonary effort is normal. No respiratory distress.     Breath sounds: Normal breath sounds. No wheezing.  Musculoskeletal:        General: Swelling (1+ edema in bilateral lower extremities) present. Normal range of motion.      Cervical back: Neck supple.  Lymphadenopathy:     Cervical: No cervical adenopathy.  Skin:    General: Skin is warm and dry.     Findings: No rash.  Neurological:     Mental Status: He is alert and oriented to person, place, and time.     Coordination: Coordination normal.  Psychiatric:        Behavior: Behavior normal.       Assessment &  Plan:   Problem List Items Addressed This Visit       Cardiovascular and Mediastinum   Essential hypertension - Primary   Relevant Medications   amLODipine  (NORVASC ) 10 MG tablet   lisinopril -hydrochlorothiazide  (ZESTORETIC ) 20-25 MG tablet   pravastatin  (PRAVACHOL ) 80 MG tablet   propranolol  (INDERAL ) 10 MG tablet   Other Relevant Orders   CBC with Differential/Platelet   CMP14+EGFR     Respiratory   Asthma in adult, mild intermittent, uncomplicated   Relevant Medications   albuterol  (VENTOLIN  HFA) 108 (90 Base) MCG/ACT inhaler   budesonide -formoterol  (SYMBICORT ) 160-4.5 MCG/ACT inhaler     Genitourinary   Chronic kidney disease, stage 3 unspecified (HCC)   Relevant Orders   CBC with Differential/Platelet   CMP14+EGFR     Other   HLD (hyperlipidemia)   Relevant Medications   amLODipine  (NORVASC ) 10 MG tablet   lisinopril -hydrochlorothiazide  (ZESTORETIC ) 20-25 MG tablet   pravastatin  (PRAVACHOL ) 80 MG tablet   propranolol  (INDERAL ) 10 MG tablet   Other Relevant Orders   Lipid panel   Depression, recurrent (HCC)   Relevant Medications   escitalopram  (LEXAPRO ) 20 MG tablet   mirtazapine  (REMERON ) 15 MG tablet   Insomnia   Relevant Medications   escitalopram  (LEXAPRO ) 20 MG tablet   mirtazapine  (REMERON ) 15 MG tablet   Rosacea   Other Visit Diagnoses       Prostate cancer screening       Relevant Orders   PSA, total and free       Will check blood work today, CBC going well, follow-up in 6 months.   Follow up plan: Return in about 6 months (around 08/07/2024), or if symptoms worsen or fail to improve, for  Hyperlipidemia and hypertension recheck.  Counseling provided for all of the vaccine components Orders Placed This Encounter  Procedures   CBC with Differential/Platelet   CMP14+EGFR   Lipid panel   PSA, total and free    Jolyne Needs, MD [facility] 02/05/2024, 3:19 PM

## 2024-02-06 LAB — CBC WITH DIFFERENTIAL/PLATELET
Basophils Absolute: 0.1 10*3/uL (ref 0.0–0.2)
Basos: 1 %
EOS (ABSOLUTE): 0.4 10*3/uL (ref 0.0–0.4)
Eos: 5 %
Hematocrit: 49.1 % (ref 37.5–51.0)
Hemoglobin: 16 g/dL (ref 13.0–17.7)
Immature Grans (Abs): 0 10*3/uL (ref 0.0–0.1)
Immature Granulocytes: 0 %
Lymphocytes Absolute: 2.4 10*3/uL (ref 0.7–3.1)
Lymphs: 34 %
MCH: 28.6 pg (ref 26.6–33.0)
MCHC: 32.6 g/dL (ref 31.5–35.7)
MCV: 88 fL (ref 79–97)
Monocytes Absolute: 0.6 10*3/uL (ref 0.1–0.9)
Monocytes: 8 %
Neutrophils Absolute: 3.6 10*3/uL (ref 1.4–7.0)
Neutrophils: 52 %
Platelets: 297 10*3/uL (ref 150–450)
RBC: 5.59 x10E6/uL (ref 4.14–5.80)
RDW: 12.3 % (ref 11.6–15.4)
WBC: 7 10*3/uL (ref 3.4–10.8)

## 2024-02-06 LAB — LIPID PANEL
Chol/HDL Ratio: 3.4 ratio (ref 0.0–5.0)
Cholesterol, Total: 141 mg/dL (ref 100–199)
HDL: 41 mg/dL (ref >39)
LDL Chol Calc (NIH): 69 mg/dL (ref 0–99)
Triglycerides: 182 mg/dL — ABNORMAL HIGH (ref 0–149)
VLDL Cholesterol Cal: 31 mg/dL (ref 5–40)

## 2024-02-06 LAB — CMP14+EGFR
ALT: 18 IU/L (ref 0–44)
AST: 18 IU/L (ref 0–40)
Albumin: 4.1 g/dL (ref 3.8–4.8)
Alkaline Phosphatase: 64 IU/L (ref 44–121)
BUN/Creatinine Ratio: 13 (ref 10–24)
BUN: 22 mg/dL (ref 8–27)
Bilirubin Total: 0.6 mg/dL (ref 0.0–1.2)
CO2: 22 mmol/L (ref 20–29)
Calcium: 9.3 mg/dL (ref 8.6–10.2)
Chloride: 101 mmol/L (ref 96–106)
Creatinine, Ser: 1.72 mg/dL — ABNORMAL HIGH (ref 0.76–1.27)
Globulin, Total: 2.8 g/dL (ref 1.5–4.5)
Glucose: 95 mg/dL (ref 70–99)
Potassium: 4.5 mmol/L (ref 3.5–5.2)
Sodium: 139 mmol/L (ref 134–144)
Total Protein: 6.9 g/dL (ref 6.0–8.5)
eGFR: 41 mL/min/{1.73_m2} — ABNORMAL LOW (ref >59)

## 2024-02-06 LAB — PSA, TOTAL AND FREE
PSA, Free Pct: 77.5 %
PSA, Free: 0.31 ng/mL
Prostate Specific Ag, Serum: 0.4 ng/mL (ref 0.0–4.0)

## 2024-02-09 ENCOUNTER — Encounter: Payer: Self-pay | Admitting: Family Medicine

## 2024-03-02 DIAGNOSIS — D631 Anemia in chronic kidney disease: Secondary | ICD-10-CM | POA: Diagnosis not present

## 2024-03-02 DIAGNOSIS — R809 Proteinuria, unspecified: Secondary | ICD-10-CM | POA: Diagnosis not present

## 2024-03-02 DIAGNOSIS — E559 Vitamin D deficiency, unspecified: Secondary | ICD-10-CM | POA: Diagnosis not present

## 2024-03-02 DIAGNOSIS — N189 Chronic kidney disease, unspecified: Secondary | ICD-10-CM | POA: Diagnosis not present

## 2024-03-02 DIAGNOSIS — N1832 Chronic kidney disease, stage 3b: Secondary | ICD-10-CM | POA: Diagnosis not present

## 2024-03-02 DIAGNOSIS — E211 Secondary hyperparathyroidism, not elsewhere classified: Secondary | ICD-10-CM | POA: Diagnosis not present

## 2024-03-11 DIAGNOSIS — I129 Hypertensive chronic kidney disease with stage 1 through stage 4 chronic kidney disease, or unspecified chronic kidney disease: Secondary | ICD-10-CM | POA: Diagnosis not present

## 2024-03-11 DIAGNOSIS — R809 Proteinuria, unspecified: Secondary | ICD-10-CM | POA: Diagnosis not present

## 2024-03-11 DIAGNOSIS — N1832 Chronic kidney disease, stage 3b: Secondary | ICD-10-CM | POA: Diagnosis not present

## 2024-03-11 DIAGNOSIS — E559 Vitamin D deficiency, unspecified: Secondary | ICD-10-CM | POA: Diagnosis not present

## 2024-03-22 ENCOUNTER — Other Ambulatory Visit: Payer: Self-pay | Admitting: Family Medicine

## 2024-08-09 ENCOUNTER — Encounter: Payer: Self-pay | Admitting: Family Medicine

## 2024-08-09 ENCOUNTER — Ambulatory Visit (INDEPENDENT_AMBULATORY_CARE_PROVIDER_SITE_OTHER): Payer: Self-pay | Admitting: Family Medicine

## 2024-08-09 VITALS — BP 135/65 | HR 57 | Ht 73.0 in | Wt 308.0 lb

## 2024-08-09 DIAGNOSIS — I10 Essential (primary) hypertension: Secondary | ICD-10-CM | POA: Diagnosis not present

## 2024-08-09 DIAGNOSIS — N1832 Chronic kidney disease, stage 3b: Secondary | ICD-10-CM | POA: Diagnosis not present

## 2024-08-09 DIAGNOSIS — E782 Mixed hyperlipidemia: Secondary | ICD-10-CM

## 2024-08-09 DIAGNOSIS — Z23 Encounter for immunization: Secondary | ICD-10-CM | POA: Diagnosis not present

## 2024-08-09 DIAGNOSIS — S46911A Strain of unspecified muscle, fascia and tendon at shoulder and upper arm level, right arm, initial encounter: Secondary | ICD-10-CM | POA: Diagnosis not present

## 2024-08-09 DIAGNOSIS — E119 Type 2 diabetes mellitus without complications: Secondary | ICD-10-CM | POA: Diagnosis not present

## 2024-08-09 DIAGNOSIS — D631 Anemia in chronic kidney disease: Secondary | ICD-10-CM | POA: Diagnosis not present

## 2024-08-09 DIAGNOSIS — J4520 Mild intermittent asthma, uncomplicated: Secondary | ICD-10-CM

## 2024-08-09 DIAGNOSIS — R809 Proteinuria, unspecified: Secondary | ICD-10-CM | POA: Diagnosis not present

## 2024-08-09 DIAGNOSIS — N189 Chronic kidney disease, unspecified: Secondary | ICD-10-CM | POA: Diagnosis not present

## 2024-08-09 MED ORDER — CETIRIZINE HCL 10 MG PO TABS: 10.0000 mg | ORAL_TABLET | Freq: Every day | ORAL | 5 refills | Status: AC

## 2024-08-09 MED ORDER — ALBUTEROL SULFATE HFA 108 (90 BASE) MCG/ACT IN AERS: INHALATION_SPRAY | RESPIRATORY_TRACT | 1 refills | Status: AC

## 2024-08-09 MED ORDER — BUDESONIDE-FORMOTEROL FUMARATE 160-4.5 MCG/ACT IN AERO: INHALATION_SPRAY | RESPIRATORY_TRACT | 1 refills | Status: AC

## 2024-08-09 MED ORDER — OLOPATADINE HCL 0.1 % OP SOLN
OPHTHALMIC | 11 refills | Status: AC
Start: 2024-08-09 — End: ?

## 2024-08-09 MED ORDER — FLUTICASONE PROPIONATE 50 MCG/ACT NA SUSP: NASAL | 5 refills | Status: AC

## 2024-08-09 MED ORDER — DICLOFENAC SODIUM 1 % EX GEL: 2.0000 g | Freq: Four times a day (QID) | CUTANEOUS | 3 refills | Status: AC

## 2024-08-09 NOTE — Progress Notes (Signed)
 BP 135/65   Pulse (!) 57   Ht 6' 1 (1.854 m)   Wt (!) 308 lb (139.7 kg)   SpO2 97%   BMI 40.64 kg/m    Subjective:   Patient ID: Martin Schultz, male    DOB: 1951/07/01, 73 y.o.   MRN: [id number]  HPI: Martin Schultz is a 73 y.o. male presenting on 08/09/2024 for Medical Management of Chronic Issues, Chronic Kidney Disease, Hypertension, and Hyperlipidemia   Discussed the use of AI scribe software for clinical note transcription with the patient, who gave verbal consent to proceed.  History of Present Illness   Martin Schultz is a 73 year old male with hypertension and chronic obstructive pulmonary disease who presents for a routine follow-up and management of upper back pain.  Upper back pain/shoulder pain - Onset after working on tractor without power steering - Localized to upper back - Worsens with movements such as pulling back and moving downwards - Described as feeling like a pulled muscle - No heating pad or massage device at home - Applies heat with a hairdryer, which provides some relief  Hypertension - Takes lisinopril  and propranolol  for blood pressure control - Monitors blood pressure at home with assistance from niece - Blood pressure generally well controlled  Mood stabilization - Takes escitalopram  20 mg daily - Mood remains stable on current regimen  Chronic asthma - Uses Symbicort  and Ventolin  inhalers for management - Has been out of inhalers for approximately two months - Inhalers previously effective in breaking up phlegm and managing breathing - No significant shortness of breath currently - Recognizes need to resume inhaler therapy  Renal health - Emily Brick for kidney health - Maintains hydration, primarily drinking water and some tea          Relevant past medical, surgical, family and social history reviewed and updated as indicated. Interim medical history since our last visit reviewed. Allergies and medications reviewed and  updated.  Review of Systems  Constitutional:  Negative for chills and fever.  Respiratory:  Negative for shortness of breath and wheezing.   Cardiovascular:  Negative for chest pain and leg swelling.  Musculoskeletal:  Positive for arthralgias and myalgias. Negative for back pain and gait problem.  Skin:  Negative for rash.  All other systems reviewed and are negative.   Per HPI unless specifically indicated above   Allergies as of 08/09/2024       Reactions   Trazodone And Nefazodone Other (See Comments)   Hallucination   Doxycycline    Kidney concerns   Benadryl [diphenhydramine] Other (See Comments)   Shakes, tremor        Medication List        Accurate as of August 09, 2024 10:53 AM. If you have any questions, ask your nurse or doctor.          albuterol  108 (90 Base) MCG/ACT inhaler Commonly known as: Ventolin  HFA INHALE 2 PUFFS 4 TIMES DAILY.   amLODipine  10 MG tablet Commonly known as: NORVASC  Take 1 tablet (10 mg total) by mouth daily.   aspirin EC 81 MG tablet Take 81 mg by mouth daily.   budesonide -formoterol  160-4.5 MCG/ACT inhaler Commonly known as: Symbicort  INHALE 2 PUFFS INTO THE LUNGS EVERY MORNING AND AT BEDTIME.   cetirizine  10 MG tablet Commonly known as: ZYRTEC  Take 1 tablet (10 mg total) by mouth daily.   cholecalciferol 1000 units tablet Commonly known as: VITAMIN D  Take 1,000 Units by mouth daily.  diclofenac Sodium 1 % Gel Commonly known as: Voltaren Apply 2 g topically 4 (four) times daily. Started by: Fonda LABOR Kathlyne Loud   escitalopram  20 MG tablet Commonly known as: LEXAPRO  Take 1 tablet (20 mg total) by mouth at bedtime.   famotidine  20 MG tablet Commonly known as: PEPCID  Take 1 tablet (20 mg total) by mouth 2 (two) times daily.   fluticasone  50 MCG/ACT nasal spray Commonly known as: FLONASE  INSTILL 1 SPRAY EACH NOSTRIL ONCE DAILY.   hydrALAZINE 50 MG tablet Commonly known as: APRESOLINE Take 50 mg by mouth  2 (two) times daily.   lisinopril -hydrochlorothiazide  20-25 MG tablet Commonly known as: ZESTORETIC  Take 1 tablet by mouth daily.   metroNIDAZOLE  0.75 % gel Commonly known as: METROGEL  APPLY 1 APPLICATION TOPICALLY AT BEDTIME.   mirtazapine  15 MG tablet Commonly known as: REMERON  Take 1 tablet (15 mg total) by mouth at bedtime.   olopatadine  0.1 % ophthalmic solution Commonly known as: PATANOL One drop into affected eye twice daily   pravastatin  80 MG tablet Commonly known as: PRAVACHOL  Take 1 tablet (80 mg total) by mouth at bedtime.   propranolol  10 MG tablet Commonly known as: INDERAL  TAKE 1 TABLET TWICE DAILY TO HELP HEADACHE AND BLOOD PRESSURE         Objective:   BP 135/65   Pulse (!) 57   Ht 6' 1 (1.854 m)   Wt (!) 308 lb (139.7 kg)   SpO2 97%   BMI 40.64 kg/m   Wt Readings from Last 3 Encounters:  08/09/24 (!) 308 lb (139.7 kg)  02/05/24 (!) 305 lb (138.3 kg)  09/19/23 (!) 303 lb (137.4 kg)    Physical Exam Physical Exam   VITALS: BP- 135/65 NECK: Thyroid normal, no masses. CHEST: Lungs clear to auscultation bilaterally. CARDIOVASCULAR: Regular rate and rhythm, no murmurs. EXTREMITIES: Mild swelling in legs. MUSCULOSKELETAL: Pain in shoulder with certain movements.         Assessment & Plan:   Problem List Items Addressed This Visit       Cardiovascular and Mediastinum   Essential hypertension - Primary   Relevant Orders   CBC with Differential/Platelet   CMP14+EGFR   Lipid panel   TSH     Respiratory   Asthma in adult, mild intermittent, uncomplicated   Relevant Medications   budesonide -formoterol  (SYMBICORT ) 160-4.5 MCG/ACT inhaler   albuterol  (VENTOLIN  HFA) 108 (90 Base) MCG/ACT inhaler   Other Relevant Orders   CBC with Differential/Platelet   CMP14+EGFR   Lipid panel   TSH     Genitourinary   Chronic kidney disease, stage 3 unspecified (HCC)   Relevant Orders   CBC with Differential/Platelet   CMP14+EGFR   Lipid panel    TSH     Other   HLD (hyperlipidemia)   Relevant Orders   CBC with Differential/Platelet   CMP14+EGFR   Lipid panel   TSH   Other Visit Diagnoses       Encounter for immunization       Relevant Orders   Flu vaccine HIGH DOSE PF(Fluzone Trivalent) (Completed)     Strain of right shoulder, initial encounter       Relevant Medications   diclofenac Sodium (VOLTAREN) 1 % GEL           Acute upper back muscle strain Likely due to overuse while operating a tractor without power steering. Pain exacerbated by certain movements, indicating muscle involvement. - Advised use of a heating pad or hairdryer for heat application to the  affected area. - Recommended stretching exercises before operating the tractor. - Advised to avoid operating the tractor for several days until the strain heals. - Topical Voltaren gel prescription was sent in as well.  Essential hypertension Blood pressure is well-controlled with current medications, lisinopril  and propranolol . Home blood pressure readings are stable. - Continue current antihypertensive medications: lisinopril  and propranolol .  Asthma Management is suboptimal due to running out of Symbicort  and Ventolin  for two months. Symptoms are not severely out of control but require medication to manage phlegm and breathing. - Refilled Symbicort  and Ventolin  prescriptions.  Depression Well-managed with escitalopram  20 mg. Mood is stable. - Continue escitalopram  20 mg daily.  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 Discussion about potential use of Celebrex for arthritis, but need to verify safety with current kidney function and Farxiga use. - Will verify safety of Celebrex with current kidney function and Farxiga use. - Advised to stay hydrated, primarily with water.          Follow up plan: Return in about 6 months (around 02/06/2025), or if symptoms worsen or fail to improve, for Physical exam and recheck chronic  medical issues.  Counseling provided for all of the vaccine components Orders Placed This Encounter  Procedures   Flu vaccine HIGH DOSE PF(Fluzone Trivalent)   CBC with Differential/Platelet   CMP14+EGFR   Lipid panel   TSH    Fonda Levins, MD [facility] 08/09/2024, 10:53 AM

## 2024-08-10 LAB — CMP14+EGFR
ALT: 25 IU/L (ref 0–44)
AST: 21 IU/L (ref 0–40)
Albumin: 4.6 g/dL (ref 3.8–4.8)
Alkaline Phosphatase: 73 IU/L (ref 47–123)
BUN/Creatinine Ratio: 13 (ref 10–24)
BUN: 21 mg/dL (ref 8–27)
Bilirubin Total: 0.9 mg/dL (ref 0.0–1.2)
CO2: 23 mmol/L (ref 20–29)
Calcium: 10.1 mg/dL (ref 8.6–10.2)
Chloride: 98 mmol/L (ref 96–106)
Creatinine, Ser: 1.65 mg/dL — ABNORMAL HIGH (ref 0.76–1.27)
Globulin, Total: 2.9 g/dL (ref 1.5–4.5)
Glucose: 93 mg/dL (ref 70–99)
Potassium: 4.4 mmol/L (ref 3.5–5.2)
Sodium: 138 mmol/L (ref 134–144)
Total Protein: 7.5 g/dL (ref 6.0–8.5)
eGFR: 43 mL/min/1.73 — ABNORMAL LOW (ref >59)

## 2024-08-10 LAB — CBC WITH DIFFERENTIAL/PLATELET
Basophils Absolute: 0.1 x10E3/uL (ref 0.0–0.2)
Basos: 1 %
EOS (ABSOLUTE): 0.5 x10E3/uL — ABNORMAL HIGH (ref 0.0–0.4)
Eos: 7 %
Hematocrit: 51.4 % — ABNORMAL HIGH (ref 37.5–51.0)
Hemoglobin: 16.9 g/dL (ref 13.0–17.7)
Immature Grans (Abs): 0 x10E3/uL (ref 0.0–0.1)
Immature Granulocytes: 0 %
Lymphocytes Absolute: 2.4 x10E3/uL (ref 0.7–3.1)
Lymphs: 31 %
MCH: 29.5 pg (ref 26.6–33.0)
MCHC: 32.9 g/dL (ref 31.5–35.7)
MCV: 90 fL (ref 79–97)
Monocytes Absolute: 0.7 x10E3/uL (ref 0.1–0.9)
Monocytes: 10 %
Neutrophils Absolute: 4 x10E3/uL (ref 1.4–7.0)
Neutrophils: 51 %
Platelets: 325 x10E3/uL (ref 150–450)
RBC: 5.73 x10E6/uL (ref 4.14–5.80)
RDW: 13 % (ref 11.6–15.4)
WBC: 7.8 x10E3/uL (ref 3.4–10.8)

## 2024-08-10 LAB — LIPID PANEL
Chol/HDL Ratio: 3.5 ratio (ref 0.0–5.0)
Cholesterol, Total: 163 mg/dL (ref 100–199)
HDL: 47 mg/dL (ref >39)
LDL Chol Calc (NIH): 84 mg/dL (ref 0–99)
Triglycerides: 192 mg/dL — ABNORMAL HIGH (ref 0–149)
VLDL Cholesterol Cal: 32 mg/dL (ref 5–40)

## 2024-08-10 LAB — TSH: TSH: 4.93 u[IU]/mL — ABNORMAL HIGH (ref 0.450–4.500)

## 2024-08-18 ENCOUNTER — Ambulatory Visit: Payer: Self-pay | Admitting: Family Medicine

## 2024-08-23 ENCOUNTER — Other Ambulatory Visit: Payer: Self-pay

## 2024-08-23 DIAGNOSIS — Z1329 Encounter for screening for other suspected endocrine disorder: Secondary | ICD-10-CM

## 2025-02-07 ENCOUNTER — Encounter: Admitting: Family Medicine
